# Patient Record
Sex: Female | Born: 1995 | Hispanic: No | Marital: Single | Smoking: Current every day smoker
Health system: Southern US, Community
[De-identification: ages and names within clinical notes are randomized; demographics above are authoritative.]

## PROBLEM LIST (undated history)

## (undated) DIAGNOSIS — O1490 Unspecified pre-eclampsia, unspecified trimester: Secondary | ICD-10-CM

## (undated) DIAGNOSIS — T8859XA Other complications of anesthesia, initial encounter: Secondary | ICD-10-CM

## (undated) DIAGNOSIS — O2340 Unspecified infection of urinary tract in pregnancy, unspecified trimester: Secondary | ICD-10-CM

## (undated) DIAGNOSIS — Z3490 Encounter for supervision of normal pregnancy, unspecified, unspecified trimester: Secondary | ICD-10-CM

## (undated) DIAGNOSIS — F419 Anxiety disorder, unspecified: Secondary | ICD-10-CM

## (undated) HISTORY — DX: Unspecified pre-eclampsia, unspecified trimester: O14.90

## (undated) HISTORY — PX: NO PAST SURGERIES: SHX2092

## (undated) HISTORY — DX: Other complications of anesthesia, initial encounter: T88.59XA

## (undated) HISTORY — DX: Unspecified infection of urinary tract in pregnancy, unspecified trimester: O23.40

---

## 2020-10-21 ENCOUNTER — Other Ambulatory Visit: Payer: Self-pay

## 2020-10-21 ENCOUNTER — Emergency Department (HOSPITAL_COMMUNITY)
Admission: EM | Admit: 2020-10-21 | Discharge: 2020-10-22 | Disposition: A | Discharge status: Home / Self Care | Payer: Self-pay | Attending: Emergency Medicine | Admitting: Emergency Medicine

## 2020-10-21 DIAGNOSIS — X500XXA Overexertion from strenuous movement or load, initial encounter: Secondary | ICD-10-CM | POA: Insufficient documentation

## 2020-10-21 DIAGNOSIS — S8391XA Sprain of unspecified site of right knee, initial encounter: Secondary | ICD-10-CM | POA: Insufficient documentation

## 2020-10-21 DIAGNOSIS — Y990 Civilian activity done for income or pay: Secondary | ICD-10-CM | POA: Insufficient documentation

## 2020-10-21 NOTE — ED Triage Notes (Signed)
Patient complaining Right leg and knee pain. Arrived by EMS. Hurt leg at work yesterday.  No pain w/ palpation Could not find relief w/ otc pain meds or ice.  142/98- 116- [id number]%

## 2020-10-22 ENCOUNTER — Emergency Department (HOSPITAL_COMMUNITY): Payer: Self-pay

## 2020-10-22 LAB — PREGNANCY, URINE: Preg Test, Ur: NEGATIVE

## 2020-10-22 MED ORDER — OXYCODONE-ACETAMINOPHEN 5-325 MG PO TABS: 1.0000 | ORAL_TABLET | ORAL | 0 refills | Status: DC | PRN

## 2020-10-22 MED ORDER — HYDROCODONE-ACETAMINOPHEN 5-325 MG PO TABS: 1.0000 | ORAL_TABLET | ORAL | 0 refills | Status: DC | PRN

## 2020-10-22 NOTE — Discharge Instructions (Signed)
I suspect that you tore a ligament in your knee.  You need to follow-up with an orthopedic surgeon to further evaluate this.  Contact your job to see if they want to send you to one of their specialists.  If not call Dr. Diamantina Providence office, listed above.

## 2020-10-22 NOTE — ED Notes (Signed)
AVS given no concerns at this time

## 2020-10-22 NOTE — ED Provider Notes (Signed)
[facility]   CSN: [id number] Arrival date & time: 10/21/20  2247     History Chief Complaint  Patient presents with  . Leg Pain    Ann Huerta is a 25 y.o. female.  Patient presents to the emergency department for evaluation of right knee injury.  Patient reports that she injured her knee yesterday at work.  She reports that she was carrying a heavy box, it turned and twisted her knee.  She felt her knee locked up and has been had pain and swelling since.  She took 4 over-the-counter ibuprofen before coming to the ER and reports that the pain is improved but she is having difficulty walking secondary to the pain.        No past medical history on file.  There are no problems to display for this patient.      OB History   No obstetric history on file.     No family history on file.     Home Medications Prior to Admission medications   Medication Sig Start Date End Date Taking? Authorizing Provider  oxyCODONE-acetaminophen (PERCOCET) 5-325 MG tablet Take 1-2 tablets by mouth every 4 (four) hours as needed. 10/22/20  Yes Artina Minella, Canary Brim, MD    Allergies    Patient has no allergy information on record.  Review of Systems   Review of Systems  Musculoskeletal: Positive for arthralgias.  Neurological: Negative.     Physical Exam Updated Vital Signs BP 126/78   Pulse (!) 103   Temp 98.2 F (36.8 C) (Oral)   Ht 5\' 3"  (1.6 m)   Wt 72.6 kg   LMP 09/24/2020 (Within Days)   SpO2 99%   BMI 28.34 kg/m   Physical Exam Vitals and nursing note reviewed.  Constitutional:      Appearance: Normal appearance.  HENT:     Head: Atraumatic.  Cardiovascular:     Pulses:          Dorsalis pedis pulses are 2+ on the right side.  Musculoskeletal:     Right knee: Swelling and effusion present. No deformity, erythema or ecchymosis. Decreased range of motion. Tenderness present. No LCL laxity, MCL laxity, ACL  laxity or PCL laxity. Normal alignment and normal patellar mobility. Normal pulse.  Skin:    General: Skin is warm and dry.     Findings: No bruising or erythema.  Neurological:     Mental Status: She is alert.     Sensory: Sensation is intact.     Motor: Motor function is intact.     ED Results / Procedures / Treatments   Labs (all labs ordered are listed, but only abnormal results are displayed) Labs Reviewed  PREGNANCY, URINE    EKG None  Radiology DG Knee Complete 4 Views Right  Result Date: 10/22/2020 CLINICAL DATA:  Right leg and knee pain EXAM: RIGHT KNEE - COMPLETE 4+ VIEW COMPARISON:  None. FINDINGS: No acute bony abnormality. Specifically, no fracture, subluxation, or dislocation. Large suprapatellar joint effusion. Anterior bowing of the distal quadriceps tendon. Significant soft tissue swelling is noted anterior to the patella as well as possible prepatellar bursal thickening. No acute bony abnormality. Specifically, no fracture, subluxation, or dislocation. Fragmented appearance of the superomedial patella with a corticated margin suggesting a bipartite configuration. IMPRESSION: 1. Large suprapatellar joint effusion resulting in some anterior bowing of the distal quadriceps tendon. Appearance concerning for internal joint derangement. 2. Significant soft tissue swelling anterior to the patella  as well as possible prepatellar bursal thickening noted as well. 3. No acute fracture or traumatic malalignment. 4. Appearance suggesting a bipartite patella though could correlate with point tenderness. Electronically Signed   By: Kreg Shropshire M.D.   On: 10/22/2020 00:53    Procedures Procedures   Medications Ordered in ED Medications - No data to display  ED Course  I have reviewed the triage vital signs and the nursing notes.  Pertinent labs & imaging results that were available during my care of the patient were reviewed by me and considered in my medical decision making  (see chart for details).    MDM Rules/Calculators/A&P                          Patient presents to the emergency department for evaluation of right knee injury.  Patient reports that she was carrying a heavy box and twisted the knee.  She reports that she felt it lock up when the injury occurred 1 day ago.  Examination today reveals significant joint effusion.  No erythema, warmth or signs of joint infection.  X-ray does not show fracture.  Presentation is concerning for internal derangement.  Patient placed in a knee immobilizer, provided analgesia.  She is to follow-up with orthopedics.  Patient might, however, need to follow-up with human resources for further direction on follow-up as this did occur at work.  Final Clinical Impression(s) / ED Diagnoses Final diagnoses:  Sprain of right knee, unspecified ligament, initial encounter    Rx / DC Orders ED Discharge Orders         Ordered    HYDROcodone-acetaminophen (NORCO/VICODIN) 5-325 MG tablet  Every 4 hours PRN,   Status:  Discontinued        10/22/20 0216    oxyCODONE-acetaminophen (PERCOCET) 5-325 MG tablet  Every 4 hours PRN        10/22/20 0224           Gilda Crease, MD 10/22/20 [phone]

## 2020-10-22 NOTE — ED Provider Notes (Incomplete)
[facility] Provider Note   CSN: [id number] Arrival date & time: 10/21/20  2247     History Chief Complaint  Patient presents with  . Leg Pain    Ann Huerta is a 25 y.o. female.  Patient presents to the emergency department for evaluation of right knee injury.  Patient reports that she injured her knee yesterday at work.  She reports that she was carrying a heavy box, it turned and twisted her knee.  She felt her knee locked up and has been had pain and swelling since.  She took 4 over-the-counter ibuprofen before coming to the ER and reports that the pain is improved but she is having difficulty walking secondary to the pain.        No past medical history on file.  There are no problems to display for this patient.   *** The histories are not reviewed yet. Please review them in the "History" navigator section and refresh this SmartLink.   OB History   No obstetric history on file.     No family history on file.     Home Medications Prior to Admission medications   Not on File    Allergies    Patient has no allergy information on record.  Review of Systems   Review of Systems  Musculoskeletal: Positive for arthralgias.  Neurological: Negative.     Physical Exam Updated Vital Signs BP 126/78   Pulse (!) 103   Temp 98.2 F (36.8 C) (Oral)   Ht 5\' 3"  (1.6 m)   Wt 72.6 kg   LMP 09/24/2020 (Within Days)   SpO2 99%   BMI 28.34 kg/m   Physical Exam Vitals and nursing note reviewed.  Constitutional:      Appearance: Normal appearance.  HENT:     Head: Atraumatic.  Cardiovascular:     Pulses:          Dorsalis pedis pulses are 2+ on the right side.  Musculoskeletal:     Right knee: Swelling and effusion present. No deformity, erythema or ecchymosis. Decreased range of motion. Tenderness present. No LCL laxity, MCL laxity, ACL laxity or PCL laxity. Normal alignment and normal patellar mobility. Normal pulse.   Skin:    General: Skin is warm and dry.     Findings: No bruising or erythema.  Neurological:     Mental Status: She is alert.     Sensory: Sensation is intact.     Motor: Motor function is intact.     ED Results / Procedures / Treatments   Labs (all labs ordered are listed, but only abnormal results are displayed) Labs Reviewed - No data to display  EKG None  Radiology No results found.  Procedures Procedures {Remember to document critical care time when appropriate:1}  Medications Ordered in ED Medications - No data to display  ED Course  I have reviewed the triage vital signs and the nursing notes.  Pertinent labs & imaging results that were available during my care of the patient were reviewed by me and considered in my medical decision making (see chart for details).    MDM Rules/Calculators/A&P                          Patient presents with pain and swelling of the right knee after a twisting injury.  Examination revealed significant joint effusion.  No overlying erythema, warmth or signs of infection.  X-ray without fracture but  there is a large effusion noted.  I do not appreciate any significant ligamentous laxity but presentation is concerning for internal derangement.  Patient placed in knee immobilizer, provided analgesia and will follow up with orthopedics in the office.  Final Clinical Impression(s) / ED Diagnoses Final diagnoses:  Sprain of right knee, unspecified ligament, initial encounter    Rx / DC Orders ED Discharge Orders    None

## 2020-11-28 ENCOUNTER — Other Ambulatory Visit: Payer: Self-pay

## 2020-11-28 ENCOUNTER — Emergency Department (HOSPITAL_COMMUNITY)
Admission: EM | Admit: 2020-11-28 | Discharge: 2020-11-28 | Disposition: A | Discharge status: Home / Self Care | Payer: Medicaid Other | Attending: Emergency Medicine | Admitting: Emergency Medicine

## 2020-11-28 DIAGNOSIS — Z0279 Encounter for issue of other medical certificate: Secondary | ICD-10-CM | POA: Insufficient documentation

## 2020-11-28 DIAGNOSIS — Z7689 Persons encountering health services in other specified circumstances: Secondary | ICD-10-CM

## 2020-11-28 NOTE — ED Triage Notes (Signed)
Pt requesting xr of R knee so she can go back to work. Reports pain and swelling better since being evaluated at [facility].

## 2020-11-28 NOTE — Discharge Instructions (Addendum)
Return to work.  If you have any new or worsening symptoms you need to follow with orthopedics

## 2020-11-28 NOTE — ED Provider Notes (Signed)
[facility] EMERGENCY DEPARTMENT Provider Note   CSN: [id number] Arrival date & time: 11/28/20  1435     History Return to work  Ann Huerta is a 25 y.o. female with past medical history who presents for evaluation of return to work note.  Right knee injury greater than 1 week ago.  Had x-rays which did not show any fractured bones however there was some swelling concern for possible internal derangement.  She was placed in knee immobilizer.  Patient states she has not been using crutches. Not using the immobilizer.  She has no pain.  She has full range of motion.  She needs a note to return to work.  States she does not to follow-up with orthopedics as she cannot afford this.  She tried to follow-up with employee health however they denied her claim.  Pain is 0/10.  She is ambulatory here in ED without difficulty.  Denies additional aggravating alleviating factors.  History obtained from patient and past medical records.  NO interpreter used.  HPI     No past medical history on file.  There are no problems to display for this patient.   History reviewed OB History   No obstetric history on file.     No family history on file.     Home Medications Prior to Admission medications   Medication Sig Start Date End Date Taking? Authorizing Provider  oxyCODONE-acetaminophen (PERCOCET) 5-325 MG tablet Take 1-2 tablets by mouth every 4 (four) hours as needed. 10/22/20   Gilda Crease, MD    Allergies    Patient has no known allergies.  Review of Systems   Review of Systems  Constitutional: Negative.   HENT: Negative.   Respiratory: Negative.   Cardiovascular: Negative.   Gastrointestinal: Negative.   Genitourinary: Negative.   Musculoskeletal: Negative.   Neurological: Negative.   All other systems reviewed and are negative.   Physical Exam Updated Vital Signs BP 114/85   Pulse 91   Temp 98.4 F (36.9 C)   Resp 14   SpO2 98%    Physical Exam Vitals and nursing note reviewed.  Constitutional:      General: She is not in acute distress.    Appearance: She is well-developed. She is not ill-appearing, toxic-appearing or diaphoretic.  HENT:     Head: Normocephalic and atraumatic.  Eyes:     Pupils: Pupils are equal, round, and reactive to light.  Cardiovascular:     Rate and Rhythm: Normal rate.  Pulmonary:     Effort: No respiratory distress.  Abdominal:     General: Bowel sounds are normal. There is no distension.  Musculoskeletal:        General: Normal range of motion.     Cervical back: Normal range of motion.     Comments: Full range of motion.  Able to flex and extend without difficulty.  Negative varus, valgus stress.  Negative anterior drawer.  No bony tenderness.  No overlying skin changes.  Skin:    General: Skin is warm and dry.     Capillary Refill: Capillary refill takes less than 2 seconds.     Comments: No edema, erythema or warmth.  No fluctuance induration.  Neurological:     General: No focal deficit present.     Mental Status: She is alert and oriented to person, place, and time.     Comments: Intact sensation Equal strength bilaterally Ambulatory in ED without difficulty     ED Results /  Procedures / Treatments   Labs (all labs ordered are listed, but only abnormal results are displayed) Labs Reviewed - No data to display  EKG None  Radiology No results found.  Procedures Procedures   Medications Ordered in ED Medications - No data to display  ED Course  I have reviewed the triage vital signs and the nursing notes.  Pertinent labs & imaging results that were available during my care of the patient were reviewed by me and considered in my medical decision making (see chart for details).  Patient here for evaluation for return to work note.  Was seen approximately 1 week ago for right knee pain after twisting it.  She is afebrile, nonseptic, non-ill-appearing.  Was  told to follow orthopedics over she cannot afford to do this.   Has not been using crutches or knee brace.  States her swelling is completely resolved.  Has a normal musculoskeletal exam.  She is neurovascularly intact.  Negative varus, valgus stress, negative anterior drawer.  No bony tenderness.  She is ambulatory here in ED without difficulty.  Provide work note.  Discussed if she has any new worsening symptoms she does need to follow-up with orthopedics  The patient has been appropriately medically screened and/or stabilized in the ED. I have low suspicion for any other emergent medical condition which would require further screening, evaluation or treatment in the ED or require inpatient management.  Patient is hemodynamically stable and in no acute distress.  Patient able to ambulate in department prior to ED.  Evaluation does not show acute pathology that would require ongoing or additional emergent interventions while in the emergency department or further inpatient treatment.  I have discussed the diagnosis with the patient and answered all questions.  Pain is been managed while in the emergency department and patient has no further complaints prior to discharge.  Patient is comfortable with plan discussed in room and is stable for discharge at this time.  I have discussed strict return precautions for returning to the emergency department.  Patient was encouraged to follow-up with PCP/specialist refer to at discharge.    MDM Rules/Calculators/A&P                           Final Clinical Impression(s) / ED Diagnoses Final diagnoses:  Return to work exam    Rx / DC Orders ED Discharge Orders    None       Jasmin Trumbull A, PA-C 11/28/20 1500    Benjiman Core, MD 11/30/20 [phone]

## 2020-12-27 ENCOUNTER — Encounter (HOSPITAL_COMMUNITY): Payer: Self-pay

## 2020-12-27 ENCOUNTER — Ambulatory Visit (HOSPITAL_COMMUNITY)
Admission: EM | Admit: 2020-12-27 | Discharge: 2020-12-27 | Disposition: A | Discharge status: Home / Self Care | Payer: No Typology Code available for payment source | Source: Ambulatory Visit | Attending: Emergency Medicine | Admitting: Emergency Medicine

## 2020-12-27 ENCOUNTER — Other Ambulatory Visit (HOSPITAL_COMMUNITY): Payer: Self-pay

## 2020-12-27 ENCOUNTER — Other Ambulatory Visit: Payer: Self-pay

## 2020-12-27 ENCOUNTER — Emergency Department (HOSPITAL_COMMUNITY)
Admission: EM | Admit: 2020-12-27 | Discharge: 2020-12-27 | Disposition: A | Discharge status: Home / Self Care | Payer: Medicaid Other | Attending: Emergency Medicine | Admitting: Emergency Medicine

## 2020-12-27 DIAGNOSIS — Z0441 Encounter for examination and observation following alleged adult rape: Secondary | ICD-10-CM | POA: Insufficient documentation

## 2020-12-27 DIAGNOSIS — T7421XA Adult sexual abuse, confirmed, initial encounter: Secondary | ICD-10-CM | POA: Insufficient documentation

## 2020-12-27 LAB — I-STAT BETA HCG BLOOD, ED (MC, WL, AP ONLY): I-stat hCG, quantitative: <5 m[IU]/mL (ref <5)

## 2020-12-27 LAB — COMPREHENSIVE METABOLIC PANEL
ALT: 12 U/L (ref 0–44)
AST: 14 U/L — ABNORMAL LOW (ref 15–41)
Albumin: 4.4 g/dL (ref 3.5–5.0)
Alkaline Phosphatase: 50 U/L (ref 38–126)
Anion gap: 5 (ref 5–15)
BUN: 13 mg/dL (ref 6–20)
CO2: 24 mmol/L (ref 22–32)
Calcium: 9.1 mg/dL (ref 8.9–10.3)
Chloride: 110 mmol/L (ref 98–111)
Creatinine, Ser: 0.8 mg/dL (ref 0.44–1.00)
GFR, Estimated: >60 mL/min (ref >60)
Glucose, Bld: 114 mg/dL — ABNORMAL HIGH (ref 70–99)
Potassium: 3.7 mmol/L (ref 3.5–5.1)
Sodium: 139 mmol/L (ref 135–145)
Total Bilirubin: 0.6 mg/dL (ref 0.3–1.2)
Total Protein: 7.5 g/dL (ref 6.5–8.1)

## 2020-12-27 LAB — HEPATITIS C ANTIBODY: HCV Ab: NONREACTIVE

## 2020-12-27 LAB — RAPID HIV SCREEN (HIV 1/2 AB+AG)
HIV 1/2 Antibodies: NONREACTIVE
HIV-1 P24 Antigen - HIV24: NONREACTIVE

## 2020-12-27 LAB — HEPATITIS B SURFACE ANTIGEN: Hepatitis B Surface Ag: NONREACTIVE

## 2020-12-27 MED ORDER — ELVITEG-COBIC-EMTRICIT-TENOFAF 150-150-200-10 MG PO TABS: 1.0000 | ORAL_TABLET | Freq: Every day | ORAL | 0 refills | Status: DC

## 2020-12-27 MED ORDER — AZITHROMYCIN 250 MG PO TABS: 1000.0000 mg | ORAL_TABLET | Freq: Once | ORAL | Status: DC

## 2020-12-27 MED ORDER — ELVITEG-COBIC-EMTRICIT-TENOFAF 150-150-200-10 MG PO TABS
1.0000 | ORAL_TABLET | Freq: Every day | ORAL | 0 refills | Status: DC
  Filled 2020-12-27 – 2020-12-28 (×2): qty 30, 30d supply, fill #0

## 2020-12-27 MED ORDER — AZITHROMYCIN 250 MG PO TABS: 2000.0000 mg | ORAL_TABLET | Freq: Once | ORAL | Status: DC

## 2020-12-27 MED ORDER — ULIPRISTAL ACETATE 30 MG PO TABS
30.0000 mg | ORAL_TABLET | Freq: Once | ORAL | Status: AC
  Administered 2020-12-27: 30 mg via ORAL

## 2020-12-27 MED ORDER — NICOTINE 14 MG/24HR TD PT24
14.0000 mg | MEDICATED_PATCH | Freq: Once | TRANSDERMAL | Status: DC
  Filled 2020-12-27: qty 1

## 2020-12-27 MED ORDER — LIDOCAINE HCL (PF) 1 % IJ SOLN
1.0000 mL | Freq: Once | INTRAMUSCULAR | Status: AC
  Administered 2020-12-27: 1 mL

## 2020-12-27 MED ORDER — METRONIDAZOLE 500 MG PO TABS
2000.0000 mg | ORAL_TABLET | Freq: Once | ORAL | Status: AC
  Administered 2020-12-27: 2000 mg via ORAL

## 2020-12-27 MED ORDER — ELVITEG-COBIC-EMTRICIT-TENOFAF 150-150-200-10 MG PREPACK
ORAL_TABLET | ORAL | Status: AC
  Administered 2020-12-27: 5 via ORAL
  Filled 2020-12-27: qty 1

## 2020-12-27 MED ORDER — AZITHROMYCIN 250 MG PO TABS
1000.0000 mg | ORAL_TABLET | Freq: Once | ORAL | Status: AC
  Administered 2020-12-27: 1000 mg via ORAL

## 2020-12-27 MED ORDER — METRONIDAZOLE 500 MG PO TABS: 2000.0000 mg | ORAL_TABLET | Freq: Once | ORAL | Status: DC

## 2020-12-27 MED ORDER — ELVITEG-COBIC-EMTRICIT-TENOFAF 150-150-200-10 MG PREPACK: 1.0000 | ORAL_TABLET | Freq: Once | ORAL | Status: AC

## 2020-12-27 MED ORDER — ONDANSETRON 4 MG PO TBDP
4.0000 mg | ORAL_TABLET | Freq: Once | ORAL | Status: AC
  Administered 2020-12-27: 4 mg via ORAL
  Filled 2020-12-27: qty 1

## 2020-12-27 MED ORDER — CEFTRIAXONE SODIUM 1 G IJ SOLR: 2.0000 g | Freq: Once | INTRAMUSCULAR | Status: DC

## 2020-12-27 MED ORDER — CEFTRIAXONE SODIUM 1 G IJ SOLR
500.0000 mg | Freq: Once | INTRAMUSCULAR | Status: AC
Start: 2020-12-27 — End: 2020-12-27
  Administered 2020-12-27: 500 mg via INTRAMUSCULAR
  Filled 2020-12-27: qty 10

## 2020-12-27 NOTE — ED Notes (Signed)
Per SANE RN- Dorena Bodo, RN- she will call [employer] for pick up for kit after she has finished collecting.
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
## 2020-12-27 NOTE — Consult Note (Signed)
The SANE/FNE (Forensic Nurse Examiner) consult has been completed. The primary RN and provider have been notified. Please contact the SANE/FNE nurse on call (listed in Amion) with any further concerns.  
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
## 2020-12-27 NOTE — ED Notes (Signed)
SANE nurse paged

## 2020-12-27 NOTE — Discharge Instructions (Signed)
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 possible side effects. Call your doctor for medical advice about side effects. You may report side effects to FDA at 1-800-FDA-1088. Where should I keep my medicine? Keep out of the reach of children and  pets. You will be instructed on how to store this drug. Protect from light. Throw away any unused drug after the expiration date. NOTE: This sheet is a summary. It may not cover all possible information. If you have questions about this medicine, talk to your doctor, pharmacist, or health care provider.  2020 Elsevier/Gold Standard (2019-03-12 18:29:21)

## 2020-12-28 ENCOUNTER — Other Ambulatory Visit (HOSPITAL_COMMUNITY): Payer: Self-pay

## 2020-12-28 LAB — RPR: RPR Ser Ql: NONREACTIVE

## 2020-12-29 ENCOUNTER — Telehealth: Payer: Self-pay | Admitting: Family Medicine
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
## 2020-12-29 NOTE — Telephone Encounter (Signed)
LVM with pt to call back to confirm appts or resch

## 2020-12-29 NOTE — Consult Note (Signed)
  Engineer, building services obtained and emailed to [employer].   BIN: [id number] PCN:  [id number] Group:  [id number] Member:  [id number]

## 2021-01-09 ENCOUNTER — Other Ambulatory Visit: Payer: Self-pay | Admitting: General Practice

## 2021-01-09 DIAGNOSIS — Z113 Encounter for screening for infections with a predominantly sexual mode of transmission: Secondary | ICD-10-CM

## 2021-01-10 ENCOUNTER — Other Ambulatory Visit: Payer: Medicaid Other

## 2021-01-18 DIAGNOSIS — T7421XA Adult sexual abuse, confirmed, initial encounter: Secondary | ICD-10-CM

## 2021-01-18 HISTORY — DX: Adult sexual abuse, confirmed, initial encounter: T74.21XA

## 2021-02-09 ENCOUNTER — Other Ambulatory Visit: Payer: Medicaid Other

## 2021-03-01 ENCOUNTER — Other Ambulatory Visit (HOSPITAL_COMMUNITY): Payer: Self-pay

## 2021-03-01 ENCOUNTER — Other Ambulatory Visit: Payer: Self-pay

## 2021-03-01 ENCOUNTER — Emergency Department (HOSPITAL_COMMUNITY)
Admission: EM | Admit: 2021-03-01 | Discharge: 2021-03-01 | Disposition: A | Discharge status: Home / Self Care | Payer: Medicaid Other | Attending: Emergency Medicine | Admitting: Emergency Medicine

## 2021-03-01 ENCOUNTER — Encounter (HOSPITAL_COMMUNITY): Payer: Self-pay

## 2021-03-01 DIAGNOSIS — H6122 Impacted cerumen, left ear: Secondary | ICD-10-CM | POA: Insufficient documentation

## 2021-03-01 DIAGNOSIS — K047 Periapical abscess without sinus: Secondary | ICD-10-CM

## 2021-03-01 LAB — I-STAT BETA HCG BLOOD, ED (MC, WL, AP ONLY): I-stat hCG, quantitative: >2000 m[IU]/mL — ABNORMAL HIGH (ref <5)

## 2021-03-01 MED ORDER — PENICILLIN V POTASSIUM 500 MG PO TABS
500.0000 mg | ORAL_TABLET | Freq: Four times a day (QID) | ORAL | 0 refills | Status: AC
  Filled 2021-03-01: qty 28, 7d supply, fill #0

## 2021-03-01 NOTE — Discharge Instructions (Addendum)
Suspect you have a dental infection, I have started you on antibiotics, please take as prescribed.  Recommend over-the-counter pain medications.  You must follow-up with a dentist for further evaluation.  Come back to the emergency department if you develop chest pain, shortness of breath, severe abdominal pain, uncontrolled nausea, vomiting, diarrhea.

## 2021-03-01 NOTE — ED Provider Notes (Signed)
[facility] EMERGENCY DEPARTMENT Provider Note   CSN: [id number] Arrival date & time: 03/01/21  1403     History No chief complaint on file.   Ann Huerta is a 25 y.o. female.  HPI  Presents with no significant medical history presents to the emergency department with chief complaint of right lower dental pain.  Patient states started proxy 2 days ago, states it came on suddenly, pain radiates from her lower jaw into her right ear, denies lips, tongue, throat swelling, difficulty swallowing, denies decrease in hearing, discharge from the ear, denies any recent trauma the area.  States she is not seen a dentist in a long time.  She denies headaches, fevers, chills, indigestion sore throat or cough.  She has no other complaints this time.  History reviewed. No pertinent past medical history.  There are no problems to display for this patient.   History reviewed. No pertinent surgical history.   OB History   No obstetric history on file.     No family history on file.     Home Medications Prior to Admission medications   Medication Sig Start Date End Date Taking? Authorizing Provider  penicillin v potassium (VEETID) 500 MG tablet Take 1 tablet (500 mg total) by mouth 4 (four) times daily for 7 days. 03/01/21 03/08/21 Yes Carroll Sage, PA-C  elvitegravir-cobicistat-emtricitabine-tenofovir (GENVOYA) 150-150-200-10 MG TABS tablet Take 1 tablet by mouth daily with breakfast. 12/27/20   Henderly, Britni A, PA-C  oxyCODONE-acetaminophen (PERCOCET) 5-325 MG tablet Take 1-2 tablets by mouth every 4 (four) hours as needed. 10/22/20   Gilda Crease, MD    Allergies    Patient has no known allergies.  Review of Systems   Review of Systems  Constitutional:  Negative for chills and fever.  HENT:  Positive for dental problem and ear pain. Negative for congestion, drooling, ear discharge, facial swelling and tinnitus.   Eyes:  Negative for visual  disturbance.  Respiratory:  Negative for shortness of breath.   Cardiovascular:  Negative for chest pain.  Gastrointestinal:  Negative for abdominal pain.  Genitourinary:  Negative for enuresis.  Musculoskeletal:  Negative for back pain.  Skin:  Negative for rash.  Neurological:  Negative for dizziness.  Hematological:  Does not bruise/bleed easily.   Physical Exam Updated Vital Signs BP 122/76 (BP Location: Right Arm)   Pulse 81   Temp 98.2 F (36.8 C) (Oral)   Resp 18   SpO2 100%   Physical Exam Vitals and nursing note reviewed.  Constitutional:      General: She is not in acute distress.    Appearance: She is not ill-appearing.  HENT:     Head: Normocephalic and atraumatic.     Right Ear: Tympanic membrane, ear canal and external ear normal.     Left Ear: There is impacted cerumen.     Nose: No congestion.     Mouth/Throat:     Mouth: Mucous membranes are moist.     Pharynx: Oropharynx is clear. No oropharyngeal exudate or posterior oropharyngeal erythema.     Comments: Oropharynx is visualized tongue and uvula are both midline, controlling oral secretions, she has noted dental cavities on the right back molar, there is no surrounding erythema or edema along the gumline, no fluctuance or induration present. Eyes:     Extraocular Movements: Extraocular movements intact.     Conjunctiva/sclera: Conjunctivae normal.  Cardiovascular:     Rate and Rhythm: Normal rate and regular rhythm.  Pulses: Normal pulses.  Pulmonary:     Effort: Pulmonary effort is normal.     Breath sounds: No rhonchi.  Skin:    General: Skin is warm and dry.  Neurological:     Mental Status: She is alert.  Psychiatric:        Mood and Affect: Mood normal.    ED Results / Procedures / Treatments   Labs (all labs ordered are listed, but only abnormal results are displayed) Labs Reviewed - No data to display  EKG None  Radiology No results found.  Procedures Procedures    Medications Ordered in ED Medications - No data to display  ED Course  I have reviewed the triage vital signs and the nursing notes.  Pertinent labs & imaging results that were available during my care of the patient were reviewed by me and considered in my medical decision making (see chart for details).    MDM Rules/Calculators/A&P                         Initial impression-presents with right dental pain.  She is alert, does not appear acute stress, vital signs reassuring.  Work-up-due to well-appearing patient, benign for exam, further lab or imaging warranted at this time.  Rule out- I have low suspicion for peritonsillar abscess, retropharyngeal abscess, or Ludwig angina as oropharynx was visualized tongue and uvula were both midline, there is no exudates, erythema or edema noted in the posterior pillars or on/ around tonsils.  Low suspicion for an abscess as gumline were palpated no fluctuance or induration felt.  Low suspicion for periorbital or orbital cellulitis as patient face had no erythematous, patient EOMs were intact, he had no pain with eye movement.  Low suspicion for otitis media or externa is no signs infection present my exam.   Plan-  Dental pain suspect secondary due to dental infection, will start on antibiotics, have her follow-up with a dentist for further evaluation.  Vital signs have remained stable, no indication for hospital admission.  .  Patient given at home care as well strict return precautions.  Patient verbalized that they understood agreed to said plan.  Final Clinical Impression(s) / ED Diagnoses Final diagnoses:  Dental infection    Rx / DC Orders ED Discharge Orders          Ordered    penicillin v potassium (VEETID) 500 MG tablet  4 times daily        03/01/21 1523             Barnie Del 03/01/21 1524    Jacalyn Lefevre, MD 03/02/21 [phone]

## 2021-03-01 NOTE — ED Triage Notes (Signed)
Patient complains of right lower dental problem with radiation to right ear x 2 days. Taking advil with some relief

## 2021-03-09 ENCOUNTER — Other Ambulatory Visit (HOSPITAL_COMMUNITY): Payer: Self-pay

## 2021-04-06 ENCOUNTER — Encounter (HOSPITAL_COMMUNITY): Payer: Self-pay

## 2021-04-06 ENCOUNTER — Emergency Department (HOSPITAL_COMMUNITY): Payer: Medicaid Other

## 2021-04-06 ENCOUNTER — Other Ambulatory Visit: Payer: Self-pay

## 2021-04-06 ENCOUNTER — Emergency Department (HOSPITAL_COMMUNITY)
Admission: EM | Admit: 2021-04-06 | Discharge: 2021-04-07 | Disposition: A | Discharge status: Home / Self Care | Payer: Medicaid Other | Attending: Emergency Medicine | Admitting: Emergency Medicine

## 2021-04-06 DIAGNOSIS — Z20822 Contact with and (suspected) exposure to covid-19: Secondary | ICD-10-CM | POA: Insufficient documentation

## 2021-04-06 DIAGNOSIS — O99332 Smoking (tobacco) complicating pregnancy, second trimester: Secondary | ICD-10-CM | POA: Insufficient documentation

## 2021-04-06 DIAGNOSIS — S9032XA Contusion of left foot, initial encounter: Secondary | ICD-10-CM | POA: Insufficient documentation

## 2021-04-06 DIAGNOSIS — Z28310 Unvaccinated for covid-19: Secondary | ICD-10-CM | POA: Insufficient documentation

## 2021-04-06 DIAGNOSIS — Z3A14 14 weeks gestation of pregnancy: Secondary | ICD-10-CM | POA: Insufficient documentation

## 2021-04-06 DIAGNOSIS — O9A212 Injury, poisoning and certain other consequences of external causes complicating pregnancy, second trimester: Secondary | ICD-10-CM | POA: Insufficient documentation

## 2021-04-06 DIAGNOSIS — R8271 Bacteriuria: Secondary | ICD-10-CM

## 2021-04-06 DIAGNOSIS — R102 Pelvic and perineal pain: Secondary | ICD-10-CM | POA: Insufficient documentation

## 2021-04-06 DIAGNOSIS — N939 Abnormal uterine and vaginal bleeding, unspecified: Secondary | ICD-10-CM

## 2021-04-06 DIAGNOSIS — F17210 Nicotine dependence, cigarettes, uncomplicated: Secondary | ICD-10-CM | POA: Insufficient documentation

## 2021-04-06 DIAGNOSIS — W231XXA Caught, crushed, jammed, or pinched between stationary objects, initial encounter: Secondary | ICD-10-CM | POA: Insufficient documentation

## 2021-04-06 DIAGNOSIS — R109 Unspecified abdominal pain: Secondary | ICD-10-CM

## 2021-04-06 DIAGNOSIS — O219 Vomiting of pregnancy, unspecified: Secondary | ICD-10-CM | POA: Insufficient documentation

## 2021-04-06 DIAGNOSIS — O2392 Unspecified genitourinary tract infection in pregnancy, second trimester: Secondary | ICD-10-CM | POA: Insufficient documentation

## 2021-04-06 LAB — URINALYSIS, ROUTINE W REFLEX MICROSCOPIC
Bilirubin Urine: NEGATIVE
Glucose, UA: NEGATIVE mg/dL
Hgb urine dipstick: NEGATIVE
Ketones, ur: 80 mg/dL — AB
Nitrite: POSITIVE — AB
Protein, ur: 100 mg/dL — AB
Specific Gravity, Urine: 1.023 (ref 1.005–1.030)
WBC, UA: >50 WBC/hpf — ABNORMAL HIGH (ref 0–5)
pH: 8 (ref 5.0–8.0)

## 2021-04-06 LAB — COMPREHENSIVE METABOLIC PANEL
ALT: 13 U/L (ref 0–44)
AST: 15 U/L (ref 15–41)
Albumin: 3.6 g/dL (ref 3.5–5.0)
Alkaline Phosphatase: 43 U/L (ref 38–126)
Anion gap: 10 (ref 5–15)
BUN: 10 mg/dL (ref 6–20)
CO2: 23 mmol/L (ref 22–32)
Calcium: 9 mg/dL (ref 8.9–10.3)
Chloride: 104 mmol/L (ref 98–111)
Creatinine, Ser: 0.81 mg/dL (ref 0.44–1.00)
GFR, Estimated: >60 mL/min (ref >60)
Glucose, Bld: 94 mg/dL (ref 70–99)
Potassium: 3.3 mmol/L — ABNORMAL LOW (ref 3.5–5.1)
Sodium: 137 mmol/L (ref 135–145)
Total Bilirubin: 0.9 mg/dL (ref 0.3–1.2)
Total Protein: 6.9 g/dL (ref 6.5–8.1)

## 2021-04-06 LAB — CBC
HCT: 33.2 % — ABNORMAL LOW (ref 36.0–46.0)
Hemoglobin: 11.4 g/dL — ABNORMAL LOW (ref 12.0–15.0)
MCH: 33.2 pg (ref 26.0–34.0)
MCHC: 34.3 g/dL (ref 30.0–36.0)
MCV: 96.8 fL (ref 80.0–100.0)
Platelets: 247 10*3/uL (ref 150–400)
RBC: 3.43 MIL/uL — ABNORMAL LOW (ref 3.87–5.11)
RDW: 13.7 % (ref 11.5–15.5)
WBC: 8.4 10*3/uL (ref 4.0–10.5)
nRBC: 0 % (ref 0.0–0.2)

## 2021-04-06 LAB — RESP PANEL BY RT-PCR (FLU A&B, COVID) ARPGX2
Influenza A by PCR: NEGATIVE
Influenza B by PCR: NEGATIVE
SARS Coronavirus 2 by RT PCR: NEGATIVE

## 2021-04-06 LAB — HCG, QUANTITATIVE, PREGNANCY: hCG, Beta Chain, Quant, S: 99323 m[IU]/mL — ABNORMAL HIGH (ref <5)

## 2021-04-06 LAB — I-STAT BETA HCG BLOOD, ED (MC, WL, AP ONLY): I-stat hCG, quantitative: >2000 m[IU]/mL — ABNORMAL HIGH (ref <5)

## 2021-04-06 LAB — LIPASE, BLOOD: Lipase: 20 U/L (ref 11–51)

## 2021-04-06 MED ORDER — DOXYLAMINE-PYRIDOXINE 10-10 MG PO TBEC
10.0000 mg | DELAYED_RELEASE_TABLET | Freq: Every day | ORAL | 0 refills | Status: DC
  Filled 2021-04-06: qty 30, 30d supply, fill #0

## 2021-04-06 MED ORDER — SODIUM CHLORIDE 0.9 % IV BOLUS
1000.0000 mL | Freq: Once | INTRAVENOUS | Status: AC
  Administered 2021-04-06: 1000 mL via INTRAVENOUS

## 2021-04-06 MED ORDER — SUCRALFATE 1 G PO TABS
1.0000 g | ORAL_TABLET | Freq: Once | ORAL | Status: AC
  Administered 2021-04-06: 1 g via ORAL
  Filled 2021-04-06: qty 1

## 2021-04-06 MED ORDER — SODIUM CHLORIDE 0.9 % IV SOLN
1.0000 g | Freq: Once | INTRAVENOUS | Status: AC
  Administered 2021-04-06: 1 g via INTRAVENOUS
  Filled 2021-04-06: qty 10

## 2021-04-06 MED ORDER — CEPHALEXIN 500 MG PO CAPS
500.0000 mg | ORAL_CAPSULE | Freq: Two times a day (BID) | ORAL | 0 refills | Status: AC
  Filled 2021-04-06: qty 14, 7d supply, fill #0

## 2021-04-06 NOTE — ED Triage Notes (Signed)
Patient c/o "vomiting acid" x a couple of months, wants a pregnancy test, and states she jammed the left 2nd toe

## 2021-04-06 NOTE — ED Provider Notes (Signed)
Ann Huerta Provider Note   CSN: [id number] Arrival date & time: 04/06/21  1500     History Chief Complaint  Patient presents with   Emesis    Ann Huerta is a 25 y.o. female.  This is 25 yo female who presents to ED with multiple complaints.  Patient reports has been having intermittent nausea and vomiting over the past 2 months.  She has been tolerating her typical oral intake.  Tolerating liquids properly.  No change to bowel or bladder function.  Denies fevers or chills.  Abdominal cramping yesterday but resolved spontaneously. No abnormal vaginal bleeding or discharge..  No recent travel or sick contacts.  Patient states for the past 24 hours she has been feeling more tired than normal, "run down." Nausea/emesis worse after oral intake but this is intermittent. Not a/w abdominal pain. Has not received COVID19 vaccine. Does not f/u with GI, does not f/u with PCP.   Pt also stubbed her toe yesterday and has a bruise, asking for XR. She is ambulatory, pain well controlled.   The history is provided by the patient. No language interpreter was used.  Emesis Associated symptoms: abdominal pain and arthralgias   Associated symptoms: no chills, no cough, no diarrhea, no fever and no headaches       History reviewed. No pertinent past medical history.  There are no problems to display for this patient.   History reviewed. No pertinent surgical history.   OB History   No obstetric history on file.     Family History  Problem Relation Age of Onset   Multiple sclerosis Mother     Social History   Tobacco Use   Smoking status: Every Day    Packs/day: 0.50    Types: Cigarettes   Smokeless tobacco: Never  Vaping Use   Vaping Use: Never used  Substance Use Topics   Alcohol use: Never   Drug use: Yes    Types: Marijuana    Home Medications Prior to Admission medications   Medication Sig Start Date End Date Taking? Authorizing  Provider  cephALEXin (KEFLEX) 500 MG capsule Take 1 capsule (500 mg total) by mouth 2 (two) times daily for 7 days. 04/06/21 04/13/21 Yes Tanda RockersGray, Arnetra Terris A, DO  Doxylamine-Pyridoxine 10-10 MG TBEC Take 10 mg by mouth daily. 04/06/21  Yes Sloan LeiterGray, Shaleka Brines A, DO  elvitegravir-cobicistat-emtricitabine-tenofovir (GENVOYA) 150-150-200-10 MG TABS tablet Take 1 tablet by mouth daily with breakfast. 12/27/20   Henderly, Britni A, PA-C  oxyCODONE-acetaminophen (PERCOCET) 5-325 MG tablet Take 1-2 tablets by mouth every 4 (four) hours as needed. 10/22/20   Gilda CreasePollina, Christopher J, MD    Allergies    Patient has no known allergies.  Review of Systems   Review of Systems  Constitutional:  Negative for chills and fever.  HENT:  Negative for facial swelling and trouble swallowing.   Eyes:  Negative for photophobia and visual disturbance.  Respiratory:  Negative for cough and shortness of breath.   Cardiovascular:  Negative for chest pain and palpitations.  Gastrointestinal:  Positive for abdominal pain, nausea and vomiting. Negative for diarrhea and rectal pain.  Endocrine: Negative for polydipsia and polyuria.  Genitourinary:  Negative for difficulty urinating and hematuria.  Musculoskeletal:  Positive for arthralgias. Negative for gait problem and joint swelling.  Skin:  Negative for pallor and rash.  Neurological:  Negative for syncope and headaches.  Psychiatric/Behavioral:  Negative for agitation and confusion.    Physical Exam Updated Vital Signs BP Marland Kitchen(!)  107/57   Pulse 69   Temp 98.2 F (36.8 C)   Resp 17   Ht 5' 3.5" (1.613 m)   Wt 72.6 kg   LMP 01/04/2021 (Approximate)   SpO2 100%   BMI 27.90 kg/m   Physical Exam Vitals and nursing note reviewed.  Constitutional:      General: She is not in acute distress.    Appearance: Normal appearance.  HENT:     Head: Normocephalic and atraumatic.     Right Ear: External ear normal.     Left Ear: External ear normal.     Nose: Nose normal.      Mouth/Throat:     Mouth: Mucous membranes are moist.  Eyes:     General: No scleral icterus.       Right eye: No discharge.        Left eye: No discharge.  Cardiovascular:     Rate and Rhythm: Normal rate and regular rhythm.     Pulses: Normal pulses.     Heart sounds: Normal heart sounds.  Pulmonary:     Effort: Pulmonary effort is normal. No respiratory distress.     Breath sounds: Normal breath sounds.  Abdominal:     General: Abdomen is flat. Bowel sounds are normal.     Palpations: Abdomen is soft.     Tenderness: There is no abdominal tenderness. There is no right CVA tenderness or left CVA tenderness.  Musculoskeletal:        General: Normal range of motion.     Cervical back: Normal range of motion.     Right lower leg: No edema.     Left lower leg: No edema.       Feet:  Skin:    General: Skin is warm and dry.     Capillary Refill: Capillary refill takes less than 2 seconds.  Neurological:     Mental Status: She is alert.  Psychiatric:        Mood and Affect: Mood normal.        Behavior: Behavior normal.    ED Results / Procedures / Treatments   Labs (all labs ordered are listed, but only abnormal results are displayed) Labs Reviewed  COMPREHENSIVE METABOLIC PANEL - Abnormal; Notable for the following components:      Result Value   Potassium 3.3 (*)    All other components within normal limits  CBC - Abnormal; Notable for the following components:   RBC 3.43 (*)    Hemoglobin 11.4 (*)    HCT 33.2 (*)    All other components within normal limits  URINALYSIS, ROUTINE W REFLEX MICROSCOPIC - Abnormal; Notable for the following components:   Color, Urine AMBER (*)    APPearance TURBID (*)    Ketones, ur 80 (*)    Protein, ur 100 (*)    Nitrite POSITIVE (*)    Leukocytes,Ua LARGE (*)    WBC, UA >50 (*)    Bacteria, UA MANY (*)    All other components within normal limits  HCG, QUANTITATIVE, PREGNANCY - Abnormal; Notable for the following components:    hCG, Beta Chain, Quant, S [id number] (*)    All other components within normal limits  I-STAT BETA HCG BLOOD, ED (MC, [facility], AP ONLY) - Abnormal; Notable for the following components:   I-stat hCG, quantitative >2,000.0 (*)    All other components within normal limits  RESP PANEL BY RT-PCR (FLU A&B, COVID) [id number]  URINE CULTURE  LIPASE, BLOOD  HCG, QUANTITATIVE, PREGNANCY    EKG None  Radiology US OB Limited  Result Date: 04/06/2021 CLINICAL DATA:  Pregnancy, cramping, vaginal bleeding EXAM: LIMITED OBSTETRIC ULTRASOUND COMPARISON:  None. FINDINGS: Number of Fetuses: 1 Heart Rate:  148 bpm Movement: Yes Presentation: Cephalic Placental Location: Anterior Previa: No Amniotic Fluid (Subjective):  Within normal limits. BPD: 6.7 cm cm 14 w  5 d MATERNAL FINDINGS: Cervix:  Appears closed. Uterus/Adnexae: No abnormality visualized. IMPRESSION: Single intrauterine pregnancy with detectable cardiac activity. No sonographic abnormality identified. Estimated gestational age [redacted] weeks 5 days. This exam is performed on an emergent basis and does not comprehensively evaluate fetal size, dating, or anatomy; follow-up complete OB US should be considered if further fetal assessment is warranted. Electronically Signed   By: Caprice Renshaw M.D.   On: 04/06/2021 18:57   DG Foot 2 Views Left  Result Date: 04/06/2021 CLINICAL DATA:  Toe injury. Left second toe pain since yesterday. Jammed toe yesterday. EXAM: LEFT FOOT - 2 VIEW COMPARISON:  None. FINDINGS: There is no evidence of fracture or dislocation. Particularly, no visualized fracture of the second toe. Incidental os navicular. There is no evidence of arthropathy or other focal bone abnormality. Soft tissues are unremarkable. IMPRESSION: Negative radiographs of the left foot. No fracture, with special attention to the second toe. Electronically Signed   By: Narda Rutherford M.D.   On: 04/06/2021 16:34    Procedures Procedures   Medications Ordered in ED Medications   sodium chloride 0.9 % bolus 1,000 mL (1,000 mLs Intravenous Bolus 04/06/21 1617)  sucralfate (CARAFATE) tablet 1 g (1 g Oral Given 04/06/21 1617)  cefTRIAXone (ROCEPHIN) 1 g in sodium chloride 0.9 % 100 mL IVPB (0 g Intravenous Stopped 04/06/21 1748)    ED Course  I have reviewed the triage vital signs and the nursing notes.  Pertinent labs & imaging results that were available during my care of the patient were reviewed by me and considered in my medical decision making (see chart for details).    MDM Rules/Calculators/A&P                           This patient complains of emesis, nausea, cramping; this involves an extensive number of treatment Options and is a complaint that carries with it a high risk of complications and Morbidity. Serious etiologies considered.   I ordered, reviewed and interpreted labs, which included bhcg which was elevated; ultrasound ordered.  Urinalysis with significant contamination however it is nitrite and leukocyte esterase positive.  Given patient is pregnant we will treat for bacteriuria.  Urine culture sent.   I ordered medication carafate, IV abx  I ordered imaging studies which included TV ultrasound and I independently    visualized and interpreted imaging which showed IUP around 14 wks 5/7 days, detectable cardiac activity.   Foot XR is non-acute. Discussed supportive care. Contusion of foot.    After the interventions stated above, I reevaluated the patient and found patient is pregnant, she was unsure about her bhcg results from a year ago. Discussed diclegis regarding n/v. She is able to tolerate PO in the ER and no further emesis. Legrand Rams this is 2/2 pregnancy.  Recommend she f/u with OBGYN in next 48 hours. She is agreeable.   Pt with UA with contamination but concerning for possible infection, culture sent. Started ABX.    The patient improved significantly and was discharged in stable condition. Detailed discussions were had with the patient  regarding current findings, and need for close f/u with PCP or on call doctor. The patient has been instructed to return immediately if the symptoms worsen in any way for re-evaluation. Patient verbalized understanding and is in agreement with current care plan. All questions answered prior to discharge.    Final Clinical Impression(s) / ED Diagnoses Final diagnoses:  Nausea and vomiting in pregnancy  Abdominal pain, unspecified abdominal location  Bacteria in urine  Contusion of left foot, initial encounter    Rx / DC Orders ED Discharge Orders          Ordered    Doxylamine-Pyridoxine 10-10 MG TBEC  Daily        04/06/21 1950    cephALEXin (KEFLEX) 500 MG capsule  2 times daily        04/06/21 2028             Sloan Leiter, DO 04/06/21 2037

## 2021-04-07 ENCOUNTER — Other Ambulatory Visit (HOSPITAL_COMMUNITY): Payer: Self-pay

## 2021-04-08 ENCOUNTER — Other Ambulatory Visit (HOSPITAL_COMMUNITY): Payer: Self-pay

## 2021-04-09 LAB — URINE CULTURE: Culture: 70000 — AB

## 2021-04-10 ENCOUNTER — Telehealth: Payer: Self-pay

## 2021-04-10 ENCOUNTER — Other Ambulatory Visit (HOSPITAL_COMMUNITY): Payer: Self-pay

## 2021-04-10 NOTE — Telephone Encounter (Signed)
Post ED Visit - Positive Culture Follow-up  Culture report reviewed by antimicrobial stewardship pharmacist: [facility] []  , Pharm.D. []  Enzo Bi, Pharm.D., BCPS AQ-ID []  , Pharm.D., BCPS []  Celedonio Miyamoto, .D., BCPS []  [place], .D., BCPS, AAHIVP []  Georgina Pillion, Pharm.D., BCPS, AAHIVP []  [address], PharmD, BCPS []  , PharmD, BCPS []  [place], PharmD, BCPS []  [address], PharmD []  , PharmD, BCPS []  Estella Husk, PharmD  Pharmacy Team [x]  Lysle Pearl, PharmD []  , PharmD []  Phillips Climes, PharmD []  , Rph []  Agapito Games) , PharmD []  Verlan Friends, PharmD []  , PharmD []  Mervyn Gay, PharmD []  , PharmD []  Vinnie Level, PharmD []  [facility], PharmD []  , PharmD []  Rennis Chris, PharmD   Positive urine culture Treated with Cephalexin, organism sensitive to the same and no further patient follow-up is required at this time.  04/10/2021, 9:20 AM

## 2021-04-11 ENCOUNTER — Other Ambulatory Visit: Payer: Self-pay | Admitting: General Practice

## 2021-04-11 ENCOUNTER — Other Ambulatory Visit: Payer: Medicaid Other

## 2021-04-11 ENCOUNTER — Other Ambulatory Visit (HOSPITAL_COMMUNITY): Payer: Self-pay

## 2021-04-11 DIAGNOSIS — Z91410 Personal history of adult physical and sexual abuse: Secondary | ICD-10-CM

## 2021-04-13 ENCOUNTER — Telehealth (INDEPENDENT_AMBULATORY_CARE_PROVIDER_SITE_OTHER): Payer: Self-pay | Admitting: *Deleted

## 2021-04-13 ENCOUNTER — Other Ambulatory Visit (HOSPITAL_COMMUNITY): Payer: Self-pay

## 2021-04-13 ENCOUNTER — Other Ambulatory Visit: Payer: Self-pay

## 2021-04-13 DIAGNOSIS — Z3A00 Weeks of gestation of pregnancy not specified: Secondary | ICD-10-CM

## 2021-04-13 DIAGNOSIS — O2340 Unspecified infection of urinary tract in pregnancy, unspecified trimester: Secondary | ICD-10-CM

## 2021-04-13 DIAGNOSIS — O09299 Supervision of pregnancy with other poor reproductive or obstetric history, unspecified trimester: Secondary | ICD-10-CM | POA: Insufficient documentation

## 2021-04-13 DIAGNOSIS — Z658 Other specified problems related to psychosocial circumstances: Secondary | ICD-10-CM | POA: Insufficient documentation

## 2021-04-13 DIAGNOSIS — O0990 Supervision of high risk pregnancy, unspecified, unspecified trimester: Secondary | ICD-10-CM

## 2021-04-13 DIAGNOSIS — F419 Anxiety disorder, unspecified: Secondary | ICD-10-CM
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
## 2021-04-13 NOTE — BH Specialist Note (Signed)
Pt did not arrive to video visit and did not answer the phone; Left HIPPA-compliant message to call back Jonahtan Manseau from [facility] at  [phone] (Avyana Puffenbarger's office).  ?; left MyChart message for patient.  ? ?

## 2021-04-13 NOTE — Patient Instructions (Signed)
  At our [facility] OB/GYN Practices, we work as an integrated team, providing care to address both physical and emotional health. Your medical provider may refer you to see our Behavioral Health Clinician [facility]) on the same day you see your medical provider, as availability permits; often scheduled virtually at your convenience.  Our [facility] is available to all patients, visits generally last between 20-30 minutes, but can be longer or shorter, depending on patient need. The [facility] offers help with stress management, coping with symptoms of depression and anxiety, major life changes , sleep issues, changing risky behavior, grief and loss, life stress, working on personal life goals, and  behavioral health issues, as these all affect your overall health and wellness.  The [facility] is NOT available for the following: FMLA paperwork, court-ordered evaluations, specialty assessments (custody or disability), letters to employers, or obtaining certification for an emotional support animal. The [facility] does not provide long-term therapy. You have the right to refuse integrated behavioral health services, or to reschedule to see the [facility] at a later date.  Confidentiality exception: If it is suspected that a child or disabled adult is being abused or neglected, we are required by law to report that to either Child Protective Services or Adult Management consultant.  If you have a diagnosis of Bipolar affective disorder, Schizophrenia, or recurrent Major depressive disorder, we will recommend that you establish care with a psychiatrist, as these are lifelong, chronic conditions, and we want your overall emotional health and medications to be more closely monitored. If you anticipate needing extended maternity leave due to mental health issues postpartum, it it recommended you inform your medical provider, so we can put in a referral to a psychiatrist as soon as possible. The [facility] is unable to recommend an extended maternity leave for mental  health issues. Your medical provider or [facility] may refer you to a therapist for ongoing, traditional therapy, or to a psychiatrist, for medication management, if it would benefit your overall health. Depending on your insurance, you may have a copay or be charged a deductible, depending on your insurance, to see the [facility]. If you are uninsured, it is recommended that you apply for financial assistance. (Forms may be requested at the front desk for in-person visits, via MyChart, or request a form during a virtual visit).  If you see the [facility] more than 6 times, you will have to complete a comprehensive clinical assessment interview with the [facility] to resume integrated services.  For virtual visits with the [facility], you must be physically in the state of [place] at the time of the visit. For example, if you live in [place], you will have to do an in-person visit with the [facility], and your out-of-state insurance may not cover behavioral health services in [place]. If you are going out of the state or country for any reason, the [facility] may see you virtually when you return to [place], but not while you are physically outside of [place].    Here is a link to the Pregnancy Navigators . Please Fill out prior to your New OB appointment.   English Link: [email or web address]  Spanish Link: [email or web address]   [email or web address] is a great resource for things you need to know for when you have your baby at the hospital

## 2021-04-15 ENCOUNTER — Other Ambulatory Visit (HOSPITAL_COMMUNITY): Payer: Self-pay

## 2021-04-17 ENCOUNTER — Ambulatory Visit: Payer: Self-pay | Admitting: Clinical

## 2021-04-17 DIAGNOSIS — Z5329 Procedure and treatment not carried out because of patient's decision for other reasons: Secondary | ICD-10-CM

## 2021-04-17 DIAGNOSIS — Z91199 Patient's noncompliance with other medical treatment and regimen due to unspecified reason: Secondary | ICD-10-CM

## 2021-04-25 ENCOUNTER — Encounter: Payer: Medicaid Other | Admitting: Family Medicine

## 2021-05-08 ENCOUNTER — Ambulatory Visit: Payer: Medicaid Other | Attending: Family Medicine

## 2021-05-08 ENCOUNTER — Ambulatory Visit: Payer: Medicaid Other

## 2021-05-11 ENCOUNTER — Other Ambulatory Visit: Payer: Self-pay

## 2021-05-11 ENCOUNTER — Emergency Department (HOSPITAL_COMMUNITY)
Admission: EM | Admit: 2021-05-11 | Discharge: 2021-05-11 | Disposition: A | Discharge status: Home / Self Care | Payer: Medicaid Other | Attending: Emergency Medicine | Admitting: Emergency Medicine

## 2021-05-11 ENCOUNTER — Encounter (HOSPITAL_COMMUNITY): Payer: Self-pay | Admitting: Emergency Medicine

## 2021-05-11 ENCOUNTER — Emergency Department (HOSPITAL_COMMUNITY): Payer: Medicaid Other

## 2021-05-11 DIAGNOSIS — Z5321 Procedure and treatment not carried out due to patient leaving prior to being seen by health care provider: Secondary | ICD-10-CM | POA: Insufficient documentation

## 2021-05-11 DIAGNOSIS — M25561 Pain in right knee: Secondary | ICD-10-CM | POA: Insufficient documentation

## 2021-05-11 NOTE — ED Provider Notes (Signed)
Emergency Medicine Provider Triage Evaluation Note  Ann Huerta , a 25 y.o. female  was evaluated in triage.  Pt complains of right knee pain.  Patient states that 2 days ago she was walking at work and her leg gave out.  Has been trying to manage with ice since without success.  Patient is ambulatory with pain.  Review of Systems  Positive: Right knee pain and swelling Negative:   Physical Exam  BP 94/60 (BP Location: Left Arm)   Pulse 90   Temp 98.9 F (37.2 C) (Oral)   Resp 18   LMP 01/04/2021 (Approximate)   SpO2 99%  Gen:   Awake, no distress  Resp:  Normal effort  MSK:   Moves extremities without difficulty Other:  Some edema and warmth noted to left knee. Dital pulses intact, no numbness or tingling  Medical Decision Making  Medically screening exam initiated at 3:24 PM.  Appropriate orders placed.  Ann Huerta was informed that the remainder of the evaluation will be completed by another provider, this initial triage assessment does not replace that evaluation, and the importance of remaining in the ED until their evaluation is complete.     Vear Clock 05/11/21 1528    Linwood Dibbles, MD 05/14/21 [phone]

## 2021-05-11 NOTE — ED Triage Notes (Signed)
Pt from work, complaining that knee gave out, pt ambulatory at triage.

## 2021-05-11 NOTE — ED Notes (Signed)
Pt did not respond when called for treatment area X3 inside and outside of lobby

## 2021-05-26 ENCOUNTER — Encounter: Payer: Medicaid Other | Admitting: Family Medicine

## 2021-06-14 ENCOUNTER — Encounter: Payer: Medicaid Other | Admitting: Family Medicine

## 2021-06-29 ENCOUNTER — Encounter: Payer: Medicaid Other | Admitting: Obstetrics & Gynecology

## 2021-07-12 ENCOUNTER — Other Ambulatory Visit: Payer: Medicaid Other

## 2021-07-12 ENCOUNTER — Other Ambulatory Visit: Payer: Self-pay

## 2021-07-12 DIAGNOSIS — Z91410 Personal history of adult physical and sexual abuse: Secondary | ICD-10-CM

## 2021-07-13 LAB — HIV ANTIBODY (ROUTINE TESTING W REFLEX): HIV Screen 4th Generation wRfx: NONREACTIVE

## 2021-07-17 ENCOUNTER — Telehealth: Payer: Self-pay | Admitting: Family Medicine
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
## 2021-07-21 ENCOUNTER — Encounter: Payer: Medicaid Other | Admitting: Advanced Practice Midwife

## 2021-08-01 ENCOUNTER — Encounter (HOSPITAL_COMMUNITY): Payer: Self-pay | Admitting: Emergency Medicine

## 2021-08-01 ENCOUNTER — Other Ambulatory Visit: Payer: Self-pay

## 2021-08-01 ENCOUNTER — Emergency Department (HOSPITAL_COMMUNITY)
Admission: EM | Admit: 2021-08-01 | Discharge: 2021-08-01 | Disposition: A | Discharge status: Home / Self Care | Payer: Self-pay | Attending: Emergency Medicine | Admitting: Emergency Medicine

## 2021-08-01 ENCOUNTER — Emergency Department (HOSPITAL_COMMUNITY): Payer: Self-pay

## 2021-08-01 DIAGNOSIS — O26893 Other specified pregnancy related conditions, third trimester: Secondary | ICD-10-CM | POA: Insufficient documentation

## 2021-08-01 DIAGNOSIS — M549 Dorsalgia, unspecified: Secondary | ICD-10-CM | POA: Insufficient documentation

## 2021-08-01 DIAGNOSIS — F17210 Nicotine dependence, cigarettes, uncomplicated: Secondary | ICD-10-CM | POA: Insufficient documentation

## 2021-08-01 DIAGNOSIS — Z3A31 31 weeks gestation of pregnancy: Secondary | ICD-10-CM | POA: Insufficient documentation

## 2021-08-01 DIAGNOSIS — R079 Chest pain, unspecified: Secondary | ICD-10-CM

## 2021-08-01 DIAGNOSIS — O99333 Smoking (tobacco) complicating pregnancy, third trimester: Secondary | ICD-10-CM | POA: Insufficient documentation

## 2021-08-01 DIAGNOSIS — R0789 Other chest pain: Secondary | ICD-10-CM | POA: Insufficient documentation

## 2021-08-01 HISTORY — DX: Encounter for supervision of normal pregnancy, unspecified, unspecified trimester: Z34.90

## 2021-08-01 LAB — HEPATIC FUNCTION PANEL
ALT: 11 U/L (ref 0–44)
AST: 13 U/L — ABNORMAL LOW (ref 15–41)
Albumin: 2.9 g/dL — ABNORMAL LOW (ref 3.5–5.0)
Alkaline Phosphatase: 76 U/L (ref 38–126)
Bilirubin, Direct: <0.1 mg/dL (ref 0.0–0.2)
Total Bilirubin: 0.2 mg/dL — ABNORMAL LOW (ref 0.3–1.2)
Total Protein: 6.2 g/dL — ABNORMAL LOW (ref 6.5–8.1)

## 2021-08-01 LAB — CBC
HCT: 32.3 % — ABNORMAL LOW (ref 36.0–46.0)
Hemoglobin: 10.8 g/dL — ABNORMAL LOW (ref 12.0–15.0)
MCH: 32.7 pg (ref 26.0–34.0)
MCHC: 33.4 g/dL (ref 30.0–36.0)
MCV: 97.9 fL (ref 80.0–100.0)
Platelets: 300 10*3/uL (ref 150–400)
RBC: 3.3 MIL/uL — ABNORMAL LOW (ref 3.87–5.11)
RDW: 14.6 % (ref 11.5–15.5)
WBC: 11.2 10*3/uL — ABNORMAL HIGH (ref 4.0–10.5)
nRBC: 0 % (ref 0.0–0.2)

## 2021-08-01 LAB — BASIC METABOLIC PANEL
Anion gap: 9 (ref 5–15)
BUN: 7 mg/dL (ref 6–20)
CO2: 22 mmol/L (ref 22–32)
Calcium: 8.4 mg/dL — ABNORMAL LOW (ref 8.9–10.3)
Chloride: 105 mmol/L (ref 98–111)
Creatinine, Ser: 0.68 mg/dL (ref 0.44–1.00)
GFR, Estimated: >60 mL/min (ref >60)
Glucose, Bld: 96 mg/dL (ref 70–99)
Potassium: 4 mmol/L (ref 3.5–5.1)
Sodium: 136 mmol/L (ref 135–145)

## 2021-08-01 LAB — URINALYSIS, ROUTINE W REFLEX MICROSCOPIC
Bilirubin Urine: NEGATIVE
Glucose, UA: NEGATIVE mg/dL
Hgb urine dipstick: NEGATIVE
Ketones, ur: NEGATIVE mg/dL
Nitrite: NEGATIVE
Protein, ur: NEGATIVE mg/dL
Specific Gravity, Urine: 1.015 (ref 1.005–1.030)
pH: 7 (ref 5.0–8.0)

## 2021-08-01 LAB — URINALYSIS, MICROSCOPIC (REFLEX)

## 2021-08-01 LAB — BRAIN NATRIURETIC PEPTIDE: B Natriuretic Peptide: 25.1 pg/mL (ref 0.0–100.0)

## 2021-08-01 LAB — TROPONIN I (HIGH SENSITIVITY): Troponin I (High Sensitivity): 5 ng/L (ref <18)

## 2021-08-01 MED ORDER — ALUM & MAG HYDROXIDE-SIMETH 200-200-20 MG/5ML PO SUSP
30.0000 mL | Freq: Once | ORAL | Status: AC
  Administered 2021-08-01: 30 mL via ORAL
  Filled 2021-08-01: qty 30

## 2021-08-01 MED ORDER — SUCRALFATE 1 G PO TABS
1.0000 g | ORAL_TABLET | Freq: Once | ORAL | Status: AC
  Administered 2021-08-01: 1 g via ORAL
  Filled 2021-08-01: qty 1

## 2021-08-01 NOTE — ED Triage Notes (Signed)
Pt. Stated, Im [redacted] weeks pregnant, Pt has not seen Dr at all during her pregnancy. [facility]

## 2021-08-01 NOTE — ED Provider Notes (Signed)
[facility] EMERGENCY DEPARTMENT Provider Note   CSN: [id number] Arrival date & time: 08/01/21  1024     History Chief Complaint  Patient presents with   Chest Pain   Back Pain    Ann Huerta is a 25 y.o. female.   Patient is a G2 P1 and approximately [redacted] weeks pregnant.  She has had no prenatal care but has a history of preeclampsia in her initial pregnancy.  She has been having 3 days of chest pain and back pain that has been constant. She states that the chest pain is worse when she coughs. She states that the pain has improved since it initially started, but it has not completely resolved.   She has had no shortness of breath, nausea, vomiting, abdominal pain, or leg swelling. She states that she is not having any concerns with baby.  Baby is still moving per normally.  No vaginal discharge or bleeding.   Chest Pain Associated symptoms: back pain   Associated symptoms: no abdominal pain, no cough, no dizziness, no fever, no headache, no nausea, no palpitations, no shortness of breath, no vomiting and no weakness   Back Pain Associated symptoms: chest pain   Associated symptoms: no abdominal pain, no dysuria, no fever, no headaches and no weakness    HPI: A 25 year old patient presents for evaluation of chest pain. Initial onset of pain was less than one hour ago. The patient's chest pain is not worse with exertion. The patient's chest pain is middle- or left-sided, is not well-localized, is not described as heaviness/pressure/tightness, is not sharp and does not radiate to the arms/jaw/neck. The patient does not complain of nausea and denies diaphoresis. The patient has no history of stroke, has no history of peripheral artery disease, has not smoked in the past 90 days, denies any history of treated diabetes, has no relevant family history of coronary artery disease (first degree relative at less than age 59), is not hypertensive, has no history of  hypercholesterolemia and does not have an elevated BMI (>=30).   Past Medical History:  Diagnosis Date   Complication of anesthesia    states still felt uc's with epidural   Pre-eclampsia    Pregnant    UTI (urinary tract infection) during pregnancy     Patient Active Problem List   Diagnosis Date Noted   Supervision of high risk pregnancy, antepartum 04/13/2021   History of pre-eclampsia in prior pregnancy, currently pregnant 04/13/2021   Psychosocial stressors 04/13/2021   Anxiety 04/13/2021   UTI (urinary tract infection) during pregnancy     No past surgical history on file.   OB History     Gravida  2   Para  1   Term  1   Preterm      AB      Living  1      SAB      IAB      Ectopic      Multiple      Live Births  1           Family History  Problem Relation Age of Onset   Multiple sclerosis Mother     Social History   Tobacco Use   Smoking status: Every Day    Packs/day: 0.25    Types: Cigarettes   Smokeless tobacco: Never  Vaping Use   Vaping Use: Never used  Substance Use Topics   Alcohol use: Never   Drug use: Yes  Frequency: 3.0 times per week    Types: Marijuana    Comment: trying to stop marijuana use    Home Medications Prior to Admission medications   Medication Sig Start Date End Date Taking? Authorizing Provider  Doxylamine-Pyridoxine 10-10 MG TBEC Take 1 tablet by mouth once daily. Patient not taking: Reported on 04/13/2021 04/06/21   Jeanell Sparrow, DO  elvitegravir-cobicistat-emtricitabine-tenofovir (GENVOYA) 150-150-200-10 MG TABS tablet Take 1 tablet by mouth daily with breakfast. 12/27/20   Henderly, Britni A, PA-C  oxyCODONE-acetaminophen (PERCOCET) 5-325 MG tablet Take 1-2 tablets by mouth every 4 (four) hours as needed. 10/22/20   Orpah Greek, MD    Allergies    Patient has no known allergies.  Review of Systems   Review of Systems  Constitutional:  Negative for chills and fever.  HENT:   Negative for congestion, rhinorrhea and sore throat.   Eyes:  Negative for visual disturbance.  Respiratory:  Negative for cough, chest tightness and shortness of breath.   Cardiovascular:  Positive for chest pain. Negative for palpitations and leg swelling.  Gastrointestinal:  Negative for abdominal pain, blood in stool, constipation, diarrhea, nausea and vomiting.  Genitourinary:  Negative for dysuria, flank pain and hematuria.  Musculoskeletal:  Positive for back pain.  Skin:  Negative for rash and wound.  Neurological:  Negative for dizziness, syncope, weakness, light-headedness and headaches.  Psychiatric/Behavioral:  Negative for confusion.   All other systems reviewed and are negative.  Physical Exam Updated Vital Signs BP 116/72 (BP Location: Left Arm)    Pulse 64    Temp 98.1 F (36.7 C) (Oral)    Resp 18    LMP 01/04/2021 (Approximate)    SpO2 100%   Physical Exam Vitals and nursing note reviewed.  Constitutional:      General: She is not in acute distress.    Appearance: Normal appearance. She is well-developed. She is not ill-appearing, toxic-appearing or diaphoretic.  HENT:     Head: Normocephalic and atraumatic.     Nose: No nasal deformity.     Mouth/Throat:     Lips: Pink. No lesions.     Mouth: Mucous membranes are moist. No injury, lacerations, oral lesions or angioedema.     Pharynx: Oropharynx is clear. Uvula midline. No pharyngeal swelling, oropharyngeal exudate, posterior oropharyngeal erythema or uvula swelling.  Eyes:     General: Gaze aligned appropriately. No scleral icterus.       Right eye: No discharge.        Left eye: No discharge.     Conjunctiva/sclera: Conjunctivae normal.     Right eye: Right conjunctiva is not injected. No exudate or hemorrhage.    Left eye: Left conjunctiva is not injected. No exudate or hemorrhage. Cardiovascular:     Rate and Rhythm: Normal rate and regular rhythm.     Pulses: Normal pulses.          Radial pulses are 2+  on the right side and 2+ on the left side.       Dorsalis pedis pulses are 2+ on the right side and 2+ on the left side.     Heart sounds: Normal heart sounds, S1 normal and S2 normal. Heart sounds not distant. No murmur heard.   No friction rub. No gallop. No S3 or S4 sounds.  Pulmonary:     Effort: Pulmonary effort is normal. No accessory muscle usage or respiratory distress.     Breath sounds: Normal breath sounds. No stridor. No wheezing, rhonchi or  rales.  Chest:     Chest wall: No tenderness.  Abdominal:     General: Abdomen is flat. Bowel sounds are normal. There is no distension.     Palpations: Abdomen is soft. There is no mass or pulsatile mass.     Tenderness: There is no abdominal tenderness. There is no guarding or rebound.     Comments: Gravid uterus  Musculoskeletal:     Cervical back: Normal range of motion. No tenderness.     Right lower leg: No edema.     Left lower leg: No edema.  Skin:    General: Skin is warm and dry.     Coloration: Skin is not jaundiced or pale.     Findings: No bruising, erythema, lesion or rash.  Neurological:     General: No focal deficit present.     Mental Status: She is alert and oriented to person, place, and time.     GCS: GCS eye subscore is 4. GCS verbal subscore is 5. GCS motor subscore is 6.  Psychiatric:        Mood and Affect: Mood normal.        Behavior: Behavior normal. Behavior is cooperative.    ED Results / Procedures / Treatments   Labs (all labs ordered are listed, but only abnormal results are displayed) Labs Reviewed  BASIC METABOLIC PANEL - Abnormal; Notable for the following components:      Result Value   Calcium 8.4 (*)    All other components within normal limits  CBC - Abnormal; Notable for the following components:   WBC 11.2 (*)    RBC 3.30 (*)    Hemoglobin 10.8 (*)    HCT 32.3 (*)    All other components within normal limits  HEPATIC FUNCTION PANEL - Abnormal; Notable for the following components:    Total Protein 6.2 (*)    Albumin 2.9 (*)    AST 13 (*)    Total Bilirubin 0.2 (*)    All other components within normal limits  URINALYSIS, ROUTINE W REFLEX MICROSCOPIC - Abnormal; Notable for the following components:   Leukocytes,Ua SMALL (*)    All other components within normal limits  URINALYSIS, MICROSCOPIC (REFLEX) - Abnormal; Notable for the following components:   Bacteria, UA RARE (*)    All other components within normal limits  BRAIN NATRIURETIC PEPTIDE  TROPONIN I (HIGH SENSITIVITY)    EKG None  Radiology DG Chest 2 View  Result Date: 08/01/2021 CLINICAL DATA:  Chest pain EXAM: CHEST - 2 VIEW COMPARISON:  None. FINDINGS: The cardiomediastinal silhouette is within normal limits. There is no focal airspace consolidation. There is no pleural effusion. There is no pneumothorax. There is a chronic left clavicle injury. There is a anterior bowing deformity of the sternum with cortical step-off seen. IMPRESSION: No evidence of acute cardiopulmonary disease. 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 Correlate with clinical history of trauma, as there is evidence of an old healed left clavicle fracture as well. Electronically Signed   By: Maurine Simmering M.D.   On: 08/01/2021 13:22    Procedures Procedures   Medications Ordered in ED Medications  alum & mag hydroxide-simeth (MAALOX/MYLANTA) 200-200-20 MG/5ML suspension 30 mL (30 mLs Oral Given 08/01/21 1356)  sucralfate (CARAFATE) tablet 1 g (1 g Oral Given 08/01/21 1359)    ED Course  I have reviewed the triage vital signs and the nursing notes.  Pertinent labs &  imaging results that were available during my care of the patient were reviewed by me and considered in my medical decision making (see chart for details).  Clinical Course as of 08/01/21 1740  Tue Aug 01, 2021  1105 G2p1 31 w pregnant. No prenatal care. Hx of preeclampsia. Three  days of chest pain that radiates to the back. Stable. [GL]  1107 Spoke with MAU. She should be evaluated for cardiac etiologies prior to be sent over to MAU. We can call rapid response OB to have baby checked over here.  [GL]  1506 HEART score 0, Wells 0 [GL]    Clinical Course User Index [GL] Jakeem Grape, Finis Bud, PA-C   MDM Rules/Calculators/A&P HEAR Score: 0                        This is a G2 P8 31-week pregnant female with a past medical history of preeclampsia, and no prenatal care who presents the emergency department with epigastric chest pain and back pain.  This is a 25 y.o. female, G2 P62, 31-week pregnant female with a past medical history of preeclampsia, and no prenatal care who presents the emergency department with epigastric chest pain and back pain.   Vitals:  stable. Afebrile. No tachycardia. Normotensive. Oxygenating at 100% on RA.  Patient is well appearing and in no acute distress. Exam with reproducible chest wall tenderness. Gravid uterus present with no abdominal pain. Otherwise unremarkable.   I personally reviewed all laboratory work and imaging. Abnormal results outlined below. - Minimal leukocytosis which is a normal finding in pregnancy. Stable anemia. BMP unremarkable. Troponin normal. Liver enzymes unremarkable. UA with no evidence of protein. Cxr with possible concern for sternal fracture given the clinical picture,  however with no evidence of trauma, this is unlikely. EKG with RBBB.   Interpretation: Unremarkable labs. I do not feel that this is representative of ACS with HEART score of 0. WELLs 0 so low suspicion for PE. Likely GI related since GI cocktail worked.   Events/Interventions: GI cocktail provided  On reassessment, patient's symptoms have improved. Her labs and imaging were overall reassuring. I do not feel that we need to pursue ultrasound or fetal monitoring while patient is in the ED since symptoms resolved, no evidence of preeclampsia, baby with  normal fetal movements, and patient's vitals are stable.   Dispo Plan: Follow up with OBGYN for prenatal care. Return precautions.   Portions of this note were generated with Scientist, clinical (histocompatibility and immunogenetics). Dictation errors may occur despite best attempts at proofreading.   Final Clinical Impression(s) / ED Diagnoses Final diagnoses:  Nonspecific chest pain    Rx / DC Orders ED Discharge Orders     None        Claudie Leach, PA-C 08/01/21 1740    Mancel Bale, MD 08/02/21 1135

## 2021-08-01 NOTE — ED Provider Notes (Signed)
Emergency Medicine Provider Triage Evaluation Note  Ann Huerta , a 25 y.o. female  was evaluated in triage.  Patient is a G2 P1 [redacted] weeks pregnant.  She has had no prenatal care but has a history of preeclampsia in her initial pregnancy.  She has been having 3 days of chest pain that radiates to her back and is been constant.  She has had no shortness of breath, nausea, vomiting, abdominal pain. She states that she is not having any concerns with baby.  Baby is still moving per normally.  No vaginal discharge or bleeding. Review of Systems  Positive: Chest pain, back pain Negative:   Physical Exam  BP 125/77    Pulse 89    Temp 97.7 F (36.5 C) (Oral)    Resp 14    LMP 01/04/2021 (Approximate)    SpO2 100%  Gen:   Awake, no distress   Resp:  Normal effort  MSK:   Moves extremities without difficulty  Other:    Medical Decision Making  Medically screening exam initiated at 11:10 AM.  Appropriate orders placed.  Lavonna Lampron was informed that the remainder of the evaluation will be completed by another provider, this initial triage assessment does not replace that evaluation, and the importance of remaining in the ED until their evaluation is complete.     Therese Sarah 08/01/21 1112    Mancel Bale, MD 08/01/21 [phone]

## 2021-08-01 NOTE — ED Triage Notes (Signed)
Pt. Stated, I started having chest pain and back pain 3 days ago. It has been constant, Denies any other symptoms.

## 2021-08-01 NOTE — Discharge Instructions (Addendum)
You were seen in the emergency department for chest pain.  We have found no emergent causes to your pain.  Please set up an appointment as soon as possible to be seen and evaluated by an OB/GYN.

## 2021-08-18 ENCOUNTER — Encounter: Payer: Medicaid Other | Admitting: Family Medicine

## 2021-08-20 NOTE — L&D Delivery Note (Signed)
OB/GYN Faculty Practice Delivery Note  Ann Huerta is a 26 y.o. G2P1001 s/p SVD at [redacted]w[redacted]d. She was admitted for IOL for severe FGR.   ROM: 2h 81m with clear fluid GBS Status: negative Maximum Maternal Temperature: 98.2  Labor Progress: Presented for IOL, was found to be 2 cm and 70%effaced and started on pitocin. Then patient was AROMed and she progressed to complete  Delivery Date/Time: 2028 on 09/23/2021 Delivery: Called to room and patient was complete and pushing. Head delivered LOA. Nuchal cord present and delivered through. Shoulder and body delivered in usual fashion. Infant with spontaneous cry, placed on mother's abdomen, dried and stimulated. Cord clamped x 2 after 1-minute delay, and cut by father of baby. Cord blood drawn. Placenta delivered spontaneously with gentle cord traction. Fundus firm with massage and Pitocin. Labia, perineum, vagina, and cervix inspected and found to have no lacerations.  Placenta: intact, 3V cord, small in appearance, given severe FGR sent to path Complications: none Lacerations: none EBL: 50 Analgesia: epidural   Infant: female   APGARs 8,9   weight pending  Warner Mccreedy, MD, MPH OB Fellow, Faculty Practice Center for [facility], [facility] 09/23/2021, 8:54 PM

## 2021-09-05 ENCOUNTER — Encounter: Payer: Self-pay | Admitting: Family Medicine

## 2021-09-05 ENCOUNTER — Other Ambulatory Visit (HOSPITAL_COMMUNITY)
Admission: RE | Admit: 2021-09-05 | Discharge: 2021-09-05 | Disposition: A | Discharge status: Home / Self Care | Payer: Medicaid Other | Source: Ambulatory Visit | Attending: Family Medicine | Admitting: Family Medicine

## 2021-09-05 ENCOUNTER — Other Ambulatory Visit: Payer: Self-pay

## 2021-09-05 ENCOUNTER — Ambulatory Visit (INDEPENDENT_AMBULATORY_CARE_PROVIDER_SITE_OTHER): Payer: Medicaid Other | Admitting: Family Medicine

## 2021-09-05 VITALS — BP 127/85 | HR 74 | Wt 166.2 lb

## 2021-09-05 DIAGNOSIS — O0990 Supervision of high risk pregnancy, unspecified, unspecified trimester: Secondary | ICD-10-CM | POA: Insufficient documentation

## 2021-09-05 DIAGNOSIS — O09299 Supervision of pregnancy with other poor reproductive or obstetric history, unspecified trimester: Secondary | ICD-10-CM | POA: Diagnosis not present

## 2021-09-05 LAB — OB RESULTS CONSOLE GC/CHLAMYDIA: Gonorrhea: NEGATIVE
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
## 2021-09-06 ENCOUNTER — Encounter: Payer: Self-pay | Admitting: *Deleted

## 2021-09-06 ENCOUNTER — Other Ambulatory Visit: Payer: Self-pay | Admitting: *Deleted

## 2021-09-06 ENCOUNTER — Ambulatory Visit (HOSPITAL_BASED_OUTPATIENT_CLINIC_OR_DEPARTMENT_OTHER): Payer: Medicaid Other | Admitting: Obstetrics and Gynecology

## 2021-09-06 ENCOUNTER — Ambulatory Visit: Payer: Medicaid Other | Admitting: *Deleted

## 2021-09-06 ENCOUNTER — Other Ambulatory Visit: Payer: Self-pay | Admitting: Family Medicine

## 2021-09-06 ENCOUNTER — Ambulatory Visit: Payer: Medicaid Other | Attending: Family Medicine

## 2021-09-06 VITALS — BP 124/78 | HR 76

## 2021-09-06 DIAGNOSIS — Z3689 Encounter for other specified antenatal screening: Secondary | ICD-10-CM

## 2021-09-06 DIAGNOSIS — F419 Anxiety disorder, unspecified: Secondary | ICD-10-CM | POA: Insufficient documentation

## 2021-09-06 DIAGNOSIS — O0990 Supervision of high risk pregnancy, unspecified, unspecified trimester: Secondary | ICD-10-CM

## 2021-09-06 DIAGNOSIS — I499 Cardiac arrhythmia, unspecified: Secondary | ICD-10-CM | POA: Diagnosis not present

## 2021-09-06 DIAGNOSIS — O365930 Maternal care for other known or suspected poor fetal growth, third trimester, not applicable or unspecified: Secondary | ICD-10-CM | POA: Insufficient documentation

## 2021-09-06 DIAGNOSIS — O0933 Supervision of pregnancy with insufficient antenatal care, third trimester: Secondary | ICD-10-CM | POA: Insufficient documentation

## 2021-09-06 DIAGNOSIS — O09293 Supervision of pregnancy with other poor reproductive or obstetric history, third trimester: Secondary | ICD-10-CM

## 2021-09-06 DIAGNOSIS — O09299 Supervision of pregnancy with other poor reproductive or obstetric history, unspecified trimester: Secondary | ICD-10-CM | POA: Diagnosis present

## 2021-09-06 DIAGNOSIS — O99330 Smoking (tobacco) complicating pregnancy, unspecified trimester: Secondary | ICD-10-CM

## 2021-09-06 DIAGNOSIS — O365990 Maternal care for other known or suspected poor fetal growth, unspecified trimester, not applicable or unspecified: Secondary | ICD-10-CM

## 2021-09-06 DIAGNOSIS — Z3A36 36 weeks gestation of pregnancy: Secondary | ICD-10-CM | POA: Diagnosis present

## 2021-09-06 DIAGNOSIS — Z658 Other specified problems related to psychosocial circumstances: Secondary | ICD-10-CM | POA: Insufficient documentation

## 2021-09-06 DIAGNOSIS — O99413 Diseases of the circulatory system complicating pregnancy, third trimester: Secondary | ICD-10-CM

## 2021-09-06 LAB — CBC/D/PLT+RPR+RH+ABO+RUBIGG...
Antibody Screen: NEGATIVE
Basophils Absolute: 0 10*3/uL (ref 0.0–0.2)
Basos: 1 %
EOS (ABSOLUTE): 0 10*3/uL (ref 0.0–0.4)
Eos: 1 %
HCV Ab: <0.1 s/co ratio (ref 0.0–0.9)
HIV Screen 4th Generation wRfx: NONREACTIVE
Hematocrit: 32.2 % — ABNORMAL LOW (ref 34.0–46.6)
Hemoglobin: 11.3 g/dL (ref 11.1–15.9)
Hepatitis B Surface Ag: NEGATIVE
Immature Grans (Abs): 0.1 10*3/uL (ref 0.0–0.1)
Immature Granulocytes: 1 %
Lymphocytes Absolute: 1.8 10*3/uL (ref 0.7–3.1)
Lymphs: 20 %
MCH: 32.9 pg (ref 26.6–33.0)
MCHC: 35.1 g/dL (ref 31.5–35.7)
MCV: 94 fL (ref 79–97)
Monocytes Absolute: 0.5 10*3/uL (ref 0.1–0.9)
Monocytes: 6 %
Neutrophils Absolute: 6.2 10*3/uL (ref 1.4–7.0)
Neutrophils: 71 %
Platelets: 299 10*3/uL (ref 150–450)
RBC: 3.43 x10E6/uL — ABNORMAL LOW (ref 3.77–5.28)
RDW: 14.5 % (ref 11.7–15.4)
RPR Ser Ql: NONREACTIVE
Rh Factor: POSITIVE
Rubella Antibodies, IGG: 1.83 index (ref >0.99)
WBC: 8.6 10*3/uL (ref 3.4–10.8)

## 2021-09-06 LAB — GC/CHLAMYDIA PROBE AMP (~~LOC~~) NOT AT ARMC
Chlamydia: NEGATIVE
Comment: NEGATIVE
Comment: NORMAL
Neisseria Gonorrhea: NEGATIVE

## 2021-09-06 LAB — HCV INTERPRETATION

## 2021-09-06 LAB — HEMOGLOBIN A1C
Est. average glucose Bld gHb Est-mCnc: 114 mg/dL
Hgb A1c MFr Bld: 5.6 % (ref 4.8–5.6)

## 2021-09-06 NOTE — Progress Notes (Signed)
Maternal-Fetal Medicine   Name: Ann Huerta DOB: January 25, 1996 MRN: [id number] Referring Provider: Clayton Lefort, MD  I had the pleasure of seeing Ms. Bankey today at the [facility]. She is G2 P1 at 36w 4d gestation with inadequate prenatal care and is here for ultrasound evaluation of her pregnancy.  Although patient has her last menstrual period date, she reports irregular cycles.  Her pregnancy was dated by 14-week ultrasound performed at our radiology department.  However, only BPD measurement was obtained to date her pregnancy.  Obstetric history: In 2008 she had a term vaginal delivery of a female infant weighing 6 pounds and 10 ounces at birth.  Her pregnancy was complicated by preeclampsia.  Past medical history: She does not have hypertension or diabetes. Social history: Patient smokes about 5 cigarettes daily and uses marijuana occasionally.  She does not use alcohol or any other drugs.  Her partner is [place], and he is in good health.   Patient has not had screening for fetal aneuploidies.  She is here to screen for gestational diabetes.  Hemoglobin A1c is within normal range (5.6%).  Ultrasound On today's ultrasound, the estimated fetal weight is at less than the 1st percentile.  Head circumference measurement is at between -2 and -3 SD.  Abdominal and femur length measurements are at the 1st percentile.  Amniotic fluid is normal and good fetal activity seen.  Fetal heart arrhythmia is consistent with premature atrial contractions are seen.  Fetal anatomical survey appears normal but limited by advanced gestational age.  Antenatal testing is reassuring.  Umbilical artery Doppler showed normal forward diastolic flow.  NST is reactive.  BPP 10/10.  Fetal growth restriction -I explained the finding of fetal growth restriction.  Even if her pregnancy is dated by last menstrual period, the estimated fetal weight will be at the 2nd percentile. -I explained  the possible causes of fetal growth restriction including placental insufficiency (most common cause), fetal chromosomal anomalies or genetic syndromes, fetal infections (no history of fever or rashes) or unknown causes. I discussed smoking cessation.  Alternatively, she may use nicotine replacement therapy (patches).  Patient is planning to quit smoking. I explained ultrasound monitoring of fetal growth restriction.  Because of late gestational age, unsure pregnancy dating and the finding of severe fetal growth restriction, I have recommended twice weekly antenatal testing. Timing of delivery: Because of her uncertain dates, delivery at [redacted] weeks gestation may result in a premature infant.  I recommend continuing antenatal testing twice weekly and deliver 2 weeks from now. Patient reports she will be unable to come for another visit this week.  Fetal heart arrhythmia I explained that premature atrial contractions are not associated with adverse outcomes.  I have recommended that she reduce her caffeinated drinks.  Recommendations -BPP, NST and UA Doppler on 09/12/2021. -NST on 09/15/2021. -BPP, NST and UA Doppler on 09/19/2021. -Delivery in the first week of February (38 to 39 weeks' gestation dated by 14-week ultrasound).  Thank you for consultation.  If you have any questions or concerns, please contact me the [facility].  Consultation including face-to-face (more than 50%) counseling 30 minutes.

## 2021-09-06 NOTE — Procedures (Signed)
Ann Huerta March 24, 1996 [redacted]w[redacted]d  Fetus A Non-Stress Test Interpretation for 09/06/21  Indication: IUGR  Fetal Heart Rate A Mode: External Baseline Rate (A): 115 bpm Variability: Moderate Accelerations: 15 x 15 Decelerations: None Multiple birth?: No  Uterine Activity Mode: Palpation, Toco Contraction Frequency (min): None Resting Tone Palpated: Relaxed Resting Time: Adequate  Interpretation (Fetal Testing) Nonstress Test Interpretation: Reactive Comments: Dr. Judeth Cornfield reviewed tracing.

## 2021-09-07 ENCOUNTER — Encounter: Payer: Self-pay | Admitting: Family Medicine

## 2021-09-07 DIAGNOSIS — O365990 Maternal care for other known or suspected poor fetal growth, unspecified trimester, not applicable or unspecified: Secondary | ICD-10-CM | POA: Insufficient documentation

## 2021-09-07 LAB — URINE CULTURE, OB REFLEX

## 2021-09-07 LAB — CULTURE, OB URINE

## 2021-09-09 LAB — CULTURE, BETA STREP (GROUP B ONLY): Strep Gp B Culture: NEGATIVE

## 2021-09-12 ENCOUNTER — Encounter: Payer: Self-pay | Admitting: Family Medicine

## 2021-09-12 ENCOUNTER — Encounter: Payer: Self-pay | Admitting: *Deleted

## 2021-09-12 ENCOUNTER — Ambulatory Visit: Payer: Medicaid Other | Admitting: *Deleted

## 2021-09-12 ENCOUNTER — Ambulatory Visit: Payer: Medicaid Other | Attending: Obstetrics and Gynecology

## 2021-09-12 ENCOUNTER — Ambulatory Visit (INDEPENDENT_AMBULATORY_CARE_PROVIDER_SITE_OTHER): Payer: Medicaid Other | Admitting: Family Medicine

## 2021-09-12 ENCOUNTER — Other Ambulatory Visit: Payer: Self-pay

## 2021-09-12 VITALS — BP 136/76 | HR 84

## 2021-09-12 VITALS — BP 121/85 | HR 85 | Wt 168.6 lb

## 2021-09-12 DIAGNOSIS — O365930 Maternal care for other known or suspected poor fetal growth, third trimester, not applicable or unspecified: Secondary | ICD-10-CM

## 2021-09-12 DIAGNOSIS — I499 Cardiac arrhythmia, unspecified: Secondary | ICD-10-CM

## 2021-09-12 DIAGNOSIS — O09299 Supervision of pregnancy with other poor reproductive or obstetric history, unspecified trimester: Secondary | ICD-10-CM | POA: Diagnosis present

## 2021-09-12 DIAGNOSIS — Z658 Other specified problems related to psychosocial circumstances: Secondary | ICD-10-CM | POA: Diagnosis present

## 2021-09-12 DIAGNOSIS — F419 Anxiety disorder, unspecified: Secondary | ICD-10-CM

## 2021-09-12 DIAGNOSIS — O99413 Diseases of the circulatory system complicating pregnancy, third trimester: Secondary | ICD-10-CM

## 2021-09-12 DIAGNOSIS — O0990 Supervision of high risk pregnancy, unspecified, unspecified trimester: Secondary | ICD-10-CM

## 2021-09-12 DIAGNOSIS — O365931 Maternal care for other known or suspected poor fetal growth, third trimester, fetus 1: Secondary | ICD-10-CM | POA: Insufficient documentation

## 2021-09-12 DIAGNOSIS — Z3A37 37 weeks gestation of pregnancy: Secondary | ICD-10-CM

## 2021-09-12 DIAGNOSIS — O09293 Supervision of pregnancy with other poor reproductive or obstetric history, third trimester: Secondary | ICD-10-CM

## 2021-09-12 DIAGNOSIS — O365990 Maternal care for other known or suspected poor fetal growth, unspecified trimester, not applicable or unspecified: Secondary | ICD-10-CM

## 2021-09-12 NOTE — Progress Notes (Signed)
° °  Subjective:  Miche Briere is a 26 y.o. G2P1001 at [redacted]w[redacted]d being seen today for ongoing prenatal care.  She is currently monitored for the following issues for this high-risk pregnancy and has Supervision of high risk pregnancy, antepartum; History of pre-eclampsia in prior pregnancy, currently pregnant; Psychosocial stressors; Anxiety; UTI (urinary tract infection) during pregnancy; and IUGR (intrauterine growth restriction) affecting care of mother on their problem list.  Patient reports no complaints.  Contractions: Irritability. Vag. Bleeding: None.  Movement: Present. Denies leaking of fluid.   The following portions of the patient's history were reviewed and updated as appropriate: allergies, current medications, past family history, past medical history, past social history, past surgical history and problem list. Problem list updated.  Objective:   Vitals:   09/12/21 1641  BP: 121/85  Pulse: 85  Weight: 168 lb 9.6 oz (76.5 kg)    Fetal Status: Fetal Heart Rate (bpm): 133   Movement: Present     General:  Alert, oriented and cooperative. Patient is in no acute distress.  Skin: Skin is warm and dry. No rash noted.   Cardiovascular: Normal heart rate noted  Respiratory: Normal respiratory effort, no problems with respiration noted  Abdomen: Soft, gravid, appropriate for gestational age. Pain/Pressure: Present     Pelvic: Vag. Bleeding: None     Cervical exam deferred        Extremities: Normal range of motion.  Edema: None  Mental Status: Normal mood and affect. Normal behavior. Normal judgment and thought content.   Urinalysis:      Assessment and Plan:  Pregnancy: G2P1001 at [redacted]w[redacted]d  1. Supervision of high risk pregnancy, antepartum BP and FHR normal Labs normal from last visit - Ambulatory referral to [place]  2. History of pre-eclampsia in prior pregnancy, currently pregnant BP normal today  3. Psychosocial stressors FOB no longer  involved Has no family or other supports Referred to navigator for resources  4. Poor fetal growth affecting management of mother in third trimester, single or unspecified fetus <1% MFM recommend IOL first week of February given uncertain dates IOL scheduled for 09/22/2021 Form faxed, orders placed  Preterm labor symptoms and general obstetric precautions including but not limited to vaginal bleeding, contractions, leaking of fluid and fetal movement were reviewed in detail with the patient. Please refer to After Visit Summary for other counseling recommendations.  Return in 1 week (on 09/19/2021) for [facility], ob visit, needs MD.   Clarnce Flock, MD

## 2021-09-12 NOTE — Procedures (Signed)
Ann Huerta 1996-01-04 [redacted]w[redacted]d  Fetus A Non-Stress Test Interpretation for 09/12/21  Indication: IUGR  Fetal Heart Rate A Mode: External Baseline Rate (A): 125 bpm Variability: Moderate Accelerations: 15 x 15 Decelerations: None Multiple birth?: No  Uterine Activity Mode: Palpation, Toco Contraction Frequency (min): UI with 1 prolonged UC Contraction Quality: Mild Resting Tone Palpated: Relaxed Resting Time: Adequate  Interpretation (Fetal Testing) Nonstress Test Interpretation: Reactive Comments: Dr. Annamaria Boots reviewed tracing.

## 2021-09-12 NOTE — Patient Instructions (Signed)

## 2021-09-13 ENCOUNTER — Telehealth (HOSPITAL_COMMUNITY): Payer: Self-pay | Admitting: *Deleted

## 2021-09-13 ENCOUNTER — Encounter: Payer: Self-pay | Admitting: *Deleted

## 2021-09-13 NOTE — BH Specialist Note (Deleted)
Integrated Behavioral Health via Telemedicine Visit  09/13/2021 Ann Huerta [id number]  Number of Integrated Behavioral Health visits: 1 Session Start time: 10:15***  Session End time: 11:15*** Total time: {IBH Total Time:21014050}  Referring Provider: *** Patient/Family location: Home*** [facility] Provider location: [facility]  All persons participating in visit: Patient *** and [facility] Leevi Cullars ***  Types of Service: {CHL AMB TYPE OF SERVICE:3325976157}  I connected with [name] and/or [name]'s {family members:20773} via  Telephone or Video Enabled Telemedicine Application  (Video is Caregility application) and verified that I am speaking with the correct person using two identifiers. Discussed confidentiality: {YES/NO:21197}  I discussed the limitations of telemedicine and the availability of in person appointments.  Discussed there is a possibility of technology failure and discussed alternative modes of communication if that failure occurs.  I discussed that engaging in this telemedicine visit, they consent to the provision of behavioral healthcare and the services will be billed under their insurance.  Patient and/or legal guardian expressed understanding and consented to Telemedicine visit: {YES/NO:21197}  Presenting Concerns: Patient and/or family reports the following symptoms/concerns: *** Duration of problem: ***; Severity of problem: {Mild/Moderate/Severe:20260}  Patient and/or Family's Strengths/Protective Factors: {CHL AMB BH PROTECTIVE FACTORS:[phone]}  Goals Addressed: Patient will:  Reduce symptoms of: {IBH Symptoms:21014056}   Increase knowledge and/or ability of: {IBH Patient Tools:21014057}   Demonstrate ability to: {IBH Goals:21014053}  Progress towards Goals: {CHL AMB BH PROGRESS TOWARDS GOALS:[phone]}  Interventions: Interventions utilized:  {IBH  Interventions:21014054} Standardized Assessments completed: {IBH Screening Tools:21014051}  Patient and/or Family Response: ***  Assessment: Patient currently experiencing ***.   Patient may benefit from ***.  Plan: Follow up with behavioral health clinician on : *** Behavioral recommendations: *** Referral(s): {IBH Referrals:21014055}  I discussed the assessment and treatment plan with the patient and/or parent/guardian. They were provided an opportunity to ask questions and all were answered. They agreed with the plan and demonstrated an understanding of the instructions.   They were advised to call back or seek an in-person evaluation if the symptoms worsen or if the condition fails to improve as anticipated.  Valetta Close Phillippa Straub, LCSW

## 2021-09-13 NOTE — Telephone Encounter (Signed)
Preadmission screen  

## 2021-09-14 ENCOUNTER — Telehealth (HOSPITAL_COMMUNITY): Payer: Self-pay | Admitting: *Deleted

## 2021-09-14 NOTE — Telephone Encounter (Signed)
Preadmission screen  

## 2021-09-15 ENCOUNTER — Other Ambulatory Visit: Payer: Self-pay | Admitting: Advanced Practice Midwife

## 2021-09-15 ENCOUNTER — Ambulatory Visit: Payer: Medicaid Other | Admitting: *Deleted

## 2021-09-15 ENCOUNTER — Encounter: Payer: Self-pay | Admitting: *Deleted

## 2021-09-15 ENCOUNTER — Ambulatory Visit: Payer: Medicaid Other

## 2021-09-15 ENCOUNTER — Other Ambulatory Visit: Payer: Self-pay

## 2021-09-15 ENCOUNTER — Telehealth (HOSPITAL_COMMUNITY): Payer: Self-pay | Admitting: *Deleted

## 2021-09-15 ENCOUNTER — Ambulatory Visit: Payer: Medicaid Other | Attending: Obstetrics and Gynecology | Admitting: *Deleted

## 2021-09-15 VITALS — BP 112/71 | HR 70

## 2021-09-15 DIAGNOSIS — Z658 Other specified problems related to psychosocial circumstances: Secondary | ICD-10-CM

## 2021-09-15 DIAGNOSIS — O365930 Maternal care for other known or suspected poor fetal growth, third trimester, not applicable or unspecified: Secondary | ICD-10-CM

## 2021-09-15 DIAGNOSIS — Z3A37 37 weeks gestation of pregnancy: Secondary | ICD-10-CM | POA: Insufficient documentation

## 2021-09-15 DIAGNOSIS — O365990 Maternal care for other known or suspected poor fetal growth, unspecified trimester, not applicable or unspecified: Secondary | ICD-10-CM | POA: Insufficient documentation

## 2021-09-15 DIAGNOSIS — F419 Anxiety disorder, unspecified: Secondary | ICD-10-CM

## 2021-09-15 DIAGNOSIS — O0990 Supervision of high risk pregnancy, unspecified, unspecified trimester: Secondary | ICD-10-CM

## 2021-09-15 DIAGNOSIS — O09299 Supervision of pregnancy with other poor reproductive or obstetric history, unspecified trimester: Secondary | ICD-10-CM

## 2021-09-15 NOTE — Procedures (Signed)
Ann Huerta [birth year] [redacted]w[redacted]d  Fetus A Non-Stress Test Interpretation for 09/15/21  Indication: IUGR  Fetal Heart Rate A Mode: External Baseline Rate (A): 120 bpm Variability: Moderate Accelerations: 15 x 15 Decelerations: None Multiple birth?: No  Uterine Activity Mode: Palpation, Toco Contraction Frequency (min): ui Resting Tone Palpated: Relaxed  Interpretation (Fetal Testing) Nonstress Test Interpretation: Reactive Overall Impression: Reassuring for gestational age Comments: Dr. Judeth Cornfield reviewed tracing

## 2021-09-15 NOTE — Telephone Encounter (Signed)
Preadmission screen  

## 2021-09-18 ENCOUNTER — Telehealth (HOSPITAL_COMMUNITY): Payer: Self-pay | Admitting: *Deleted

## 2021-09-18 NOTE — Telephone Encounter (Signed)
Preadmission screen  

## 2021-09-19 ENCOUNTER — Ambulatory Visit (HOSPITAL_BASED_OUTPATIENT_CLINIC_OR_DEPARTMENT_OTHER): Payer: Medicaid Other | Admitting: Maternal & Fetal Medicine

## 2021-09-19 ENCOUNTER — Ambulatory Visit (HOSPITAL_BASED_OUTPATIENT_CLINIC_OR_DEPARTMENT_OTHER): Payer: Medicaid Other | Admitting: *Deleted

## 2021-09-19 ENCOUNTER — Ambulatory Visit: Payer: Medicaid Other

## 2021-09-19 ENCOUNTER — Ambulatory Visit: Payer: Medicaid Other | Admitting: *Deleted

## 2021-09-19 ENCOUNTER — Other Ambulatory Visit: Payer: Self-pay

## 2021-09-19 ENCOUNTER — Ambulatory Visit: Payer: Medicaid Other | Attending: Obstetrics and Gynecology

## 2021-09-19 ENCOUNTER — Ambulatory Visit (INDEPENDENT_AMBULATORY_CARE_PROVIDER_SITE_OTHER): Payer: Self-pay | Admitting: Family Medicine

## 2021-09-19 ENCOUNTER — Telehealth (HOSPITAL_COMMUNITY): Payer: Self-pay | Admitting: *Deleted

## 2021-09-19 VITALS — BP 122/84 | HR 91

## 2021-09-19 DIAGNOSIS — O365930 Maternal care for other known or suspected poor fetal growth, third trimester, not applicable or unspecified: Secondary | ICD-10-CM

## 2021-09-19 DIAGNOSIS — O0990 Supervision of high risk pregnancy, unspecified, unspecified trimester: Secondary | ICD-10-CM | POA: Insufficient documentation

## 2021-09-19 DIAGNOSIS — Z3A38 38 weeks gestation of pregnancy: Secondary | ICD-10-CM

## 2021-09-19 DIAGNOSIS — O365910 Maternal care for other known or suspected poor fetal growth, first trimester, not applicable or unspecified: Secondary | ICD-10-CM | POA: Insufficient documentation

## 2021-09-19 DIAGNOSIS — O09299 Supervision of pregnancy with other poor reproductive or obstetric history, unspecified trimester: Secondary | ICD-10-CM | POA: Insufficient documentation

## 2021-09-19 DIAGNOSIS — F419 Anxiety disorder, unspecified: Secondary | ICD-10-CM | POA: Diagnosis present

## 2021-09-19 DIAGNOSIS — O2340 Unspecified infection of urinary tract in pregnancy, unspecified trimester: Secondary | ICD-10-CM

## 2021-09-19 DIAGNOSIS — Z658 Other specified problems related to psychosocial circumstances: Secondary | ICD-10-CM | POA: Diagnosis present

## 2021-09-19 DIAGNOSIS — O365990 Maternal care for other known or suspected poor fetal growth, unspecified trimester, not applicable or unspecified: Secondary | ICD-10-CM | POA: Insufficient documentation

## 2021-09-19 NOTE — Procedures (Signed)
Ann Huerta 1995-09-27 [redacted]w[redacted]d  Fetus A Non-Stress Test Interpretation for 09/19/21  Indication: IUGR  Fetal Heart Rate A Mode: External Baseline Rate (A): 120 bpm Variability: Moderate Accelerations: 15 x 15 Decelerations: None Multiple birth?: No  Uterine Activity Mode: Palpation, Toco Contraction Frequency (min): 1 UC Contraction Quality: Mild Resting Tone Palpated: Relaxed Resting Time: Adequate  Interpretation (Fetal Testing) Nonstress Test Interpretation: Reactive Comments: Dr. Gertie Exon reviewed tracing.

## 2021-09-19 NOTE — Progress Notes (Signed)
Patient arrived late, went to MFM first. Reportedly had normal antenatal testing, did not stop by our office for prenatal visit afterwards. Has IOL scheduled for later this week.

## 2021-09-19 NOTE — Telephone Encounter (Signed)
Preadmission screen  

## 2021-09-19 NOTE — Progress Notes (Signed)
MFM Brief Note  Ms. Garvie is a 30 weeks today and is here for antenatal testing at the request of Dr. Clayton Lefort.   Antenatal testing with a prior EFW  and AC at <1%. Biophysical profile 10/10 UA Dopplers are normal without evidence of AEDF or REDF. There is good fetal movement and amniotic fluid volume.  Ms. Thien reports good fetal movement as well and was instructed on performing daily kick counts.  Typically we would recommend delivery at 37 weeks for severe fetal growth restriction, however, after speaking with Dr. Donalee Citrin, he was uncomfortable dating the pregnancy based on limited biometry of a BPD.  Ms. Antonacci is scheduled for delivery on Friday.  All questions answered.   I spent 15 minutes with > 50% in face to face consultation.  Vikki Ports, MD.

## 2021-09-20 ENCOUNTER — Encounter (HOSPITAL_COMMUNITY): Payer: Self-pay | Admitting: Family Medicine
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
## 2021-09-22 ENCOUNTER — Inpatient Hospital Stay (HOSPITAL_COMMUNITY): Payer: Medicaid Other

## 2021-09-22 DIAGNOSIS — F419 Anxiety disorder, unspecified: Secondary | ICD-10-CM

## 2021-09-22 DIAGNOSIS — O09299 Supervision of pregnancy with other poor reproductive or obstetric history, unspecified trimester: Secondary | ICD-10-CM

## 2021-09-22 DIAGNOSIS — O2340 Unspecified infection of urinary tract in pregnancy, unspecified trimester: Secondary | ICD-10-CM

## 2021-09-22 DIAGNOSIS — Z658 Other specified problems related to psychosocial circumstances: Secondary | ICD-10-CM

## 2021-09-22 DIAGNOSIS — O0990 Supervision of high risk pregnancy, unspecified, unspecified trimester: Secondary | ICD-10-CM

## 2021-09-22 NOTE — Progress Notes (Signed)
Patient scheduled for AM induction on 09/22/2021.  Unable to reach patient.  She has been called 3 times this morning ([phone], [phone], [phone]) with voice message to contact [phone] regarding her induction.

## 2021-09-23 ENCOUNTER — Inpatient Hospital Stay (HOSPITAL_COMMUNITY): Payer: Medicaid Other | Attending: Family Medicine

## 2021-09-23 ENCOUNTER — Other Ambulatory Visit: Payer: Self-pay

## 2021-09-23 ENCOUNTER — Inpatient Hospital Stay (HOSPITAL_COMMUNITY): Payer: Medicaid Other | Admitting: Anesthesiology

## 2021-09-23 ENCOUNTER — Encounter (HOSPITAL_COMMUNITY): Payer: Self-pay | Admitting: Family Medicine

## 2021-09-23 ENCOUNTER — Inpatient Hospital Stay (HOSPITAL_COMMUNITY)
Admission: AD | Admit: 2021-09-23 | Discharge: 2021-09-25 | DRG: 807 | Disposition: A | Discharge status: Home / Self Care | Payer: Medicaid Other | Attending: Obstetrics and Gynecology | Admitting: Obstetrics and Gynecology

## 2021-09-23 DIAGNOSIS — O365930 Maternal care for other known or suspected poor fetal growth, third trimester, not applicable or unspecified: Secondary | ICD-10-CM | POA: Diagnosis present

## 2021-09-23 DIAGNOSIS — F17210 Nicotine dependence, cigarettes, uncomplicated: Secondary | ICD-10-CM | POA: Diagnosis present

## 2021-09-23 DIAGNOSIS — O99334 Smoking (tobacco) complicating childbirth: Secondary | ICD-10-CM | POA: Diagnosis present

## 2021-09-23 DIAGNOSIS — Z3A39 39 weeks gestation of pregnancy: Secondary | ICD-10-CM | POA: Diagnosis not present

## 2021-09-23 DIAGNOSIS — Z20822 Contact with and (suspected) exposure to covid-19: Secondary | ICD-10-CM | POA: Diagnosis present

## 2021-09-23 DIAGNOSIS — F419 Anxiety disorder, unspecified: Secondary | ICD-10-CM

## 2021-09-23 DIAGNOSIS — O2340 Unspecified infection of urinary tract in pregnancy, unspecified trimester: Secondary | ICD-10-CM

## 2021-09-23 DIAGNOSIS — O09299 Supervision of pregnancy with other poor reproductive or obstetric history, unspecified trimester: Secondary | ICD-10-CM

## 2021-09-23 DIAGNOSIS — Z658 Other specified problems related to psychosocial circumstances: Secondary | ICD-10-CM

## 2021-09-23 DIAGNOSIS — O0990 Supervision of high risk pregnancy, unspecified, unspecified trimester: Secondary | ICD-10-CM

## 2021-09-23 DIAGNOSIS — O365990 Maternal care for other known or suspected poor fetal growth, unspecified trimester, not applicable or unspecified: Secondary | ICD-10-CM

## 2021-09-23 HISTORY — DX: Anxiety disorder, unspecified: F41.9

## 2021-09-23 LAB — TYPE AND SCREEN
ABO/RH(D): A POS
Antibody Screen: NEGATIVE

## 2021-09-23 LAB — CBC
HCT: 34.6 % — ABNORMAL LOW (ref 36.0–46.0)
Hemoglobin: 11.7 g/dL — ABNORMAL LOW (ref 12.0–15.0)
MCH: 32.1 pg (ref 26.0–34.0)
MCHC: 33.8 g/dL (ref 30.0–36.0)
MCV: 94.8 fL (ref 80.0–100.0)
Platelets: 315 10*3/uL (ref 150–400)
RBC: 3.65 MIL/uL — ABNORMAL LOW (ref 3.87–5.11)
RDW: 15.7 % — ABNORMAL HIGH (ref 11.5–15.5)
WBC: 7.2 10*3/uL (ref 4.0–10.5)
nRBC: 0 % (ref 0.0–0.2)

## 2021-09-23 LAB — RESP PANEL BY RT-PCR (FLU A&B, COVID) ARPGX2
Influenza A by PCR: NEGATIVE
Influenza B by PCR: NEGATIVE
SARS Coronavirus 2 by RT PCR: NEGATIVE

## 2021-09-23 MED ORDER — COCONUT OIL OIL: 1.0000 "application " | TOPICAL_OIL | Status: DC | PRN

## 2021-09-23 MED ORDER — FENTANYL-BUPIVACAINE-NACL 0.5-0.125-0.9 MG/250ML-% EP SOLN
12.0000 mL/h | EPIDURAL | Status: DC | PRN
  Administered 2021-09-23: 12 mL/h via EPIDURAL
  Filled 2021-09-23: qty 250

## 2021-09-23 MED ORDER — TETANUS-DIPHTH-ACELL PERTUSSIS 5-2.5-18.5 LF-MCG/0.5 IM SUSY: 0.5000 mL | PREFILLED_SYRINGE | Freq: Once | INTRAMUSCULAR | Status: DC

## 2021-09-23 MED ORDER — MEDROXYPROGESTERONE ACETATE 150 MG/ML IM SUSP: 150.0000 mg | INTRAMUSCULAR | Status: DC | PRN

## 2021-09-23 MED ORDER — TERBUTALINE SULFATE 1 MG/ML IJ SOLN: 0.2500 mg | Freq: Once | INTRAMUSCULAR | Status: DC | PRN

## 2021-09-23 MED ORDER — SENNOSIDES-DOCUSATE SODIUM 8.6-50 MG PO TABS
2.0000 | ORAL_TABLET | Freq: Every day | ORAL | Status: DC
  Filled 2021-09-23 (×2): qty 2

## 2021-09-23 MED ORDER — SOD CITRATE-CITRIC ACID 500-334 MG/5ML PO SOLN: 30.0000 mL | ORAL | Status: DC | PRN

## 2021-09-23 MED ORDER — OXYTOCIN BOLUS FROM INFUSION
333.0000 mL | Freq: Once | INTRAVENOUS | Status: AC
  Administered 2021-09-23: 333 mL via INTRAVENOUS

## 2021-09-23 MED ORDER — MEASLES, MUMPS & RUBELLA VAC IJ SOLR: 0.5000 mL | Freq: Once | INTRAMUSCULAR | Status: DC

## 2021-09-23 MED ORDER — DIPHENHYDRAMINE HCL 25 MG PO CAPS: 25.0000 mg | ORAL_CAPSULE | Freq: Four times a day (QID) | ORAL | Status: DC | PRN

## 2021-09-23 MED ORDER — SIMETHICONE 80 MG PO CHEW: 80.0000 mg | CHEWABLE_TABLET | ORAL | Status: DC | PRN

## 2021-09-23 MED ORDER — FENTANYL CITRATE (PF) 100 MCG/2ML IJ SOLN
100.0000 ug | INTRAMUSCULAR | Status: DC | PRN
  Administered 2021-09-23: 100 ug via INTRAVENOUS
  Filled 2021-09-23: qty 2

## 2021-09-23 MED ORDER — OXYTOCIN-SODIUM CHLORIDE 30-0.9 UT/500ML-% IV SOLN
1.0000 m[IU]/min | INTRAVENOUS | Status: DC
  Administered 2021-09-23: 2 m[IU]/min via INTRAVENOUS
  Filled 2021-09-23: qty 500

## 2021-09-23 MED ORDER — EPHEDRINE 5 MG/ML INJ: 10.0000 mg | INTRAVENOUS | Status: DC | PRN

## 2021-09-23 MED ORDER — LIDOCAINE HCL (PF) 1 % IJ SOLN: 30.0000 mL | INTRAMUSCULAR | Status: DC | PRN

## 2021-09-23 MED ORDER — ACETAMINOPHEN 325 MG PO TABS
650.0000 mg | ORAL_TABLET | ORAL | Status: DC | PRN
  Administered 2021-09-24: 650 mg via ORAL
  Filled 2021-09-23: qty 2

## 2021-09-23 MED ORDER — ONDANSETRON HCL 4 MG/2ML IJ SOLN: 4.0000 mg | Freq: Four times a day (QID) | INTRAMUSCULAR | Status: DC | PRN

## 2021-09-23 MED ORDER — LACTATED RINGERS IV SOLN: 500.0000 mL | INTRAVENOUS | Status: DC | PRN

## 2021-09-23 MED ORDER — LACTATED RINGERS IV SOLN: INTRAVENOUS | Status: DC

## 2021-09-23 MED ORDER — PRENATAL MULTIVITAMIN CH
1.0000 | ORAL_TABLET | Freq: Every day | ORAL | Status: DC
  Administered 2021-09-25: 1 via ORAL
  Filled 2021-09-23 (×2): qty 1

## 2021-09-23 MED ORDER — ONDANSETRON HCL 4 MG PO TABS: 4.0000 mg | ORAL_TABLET | ORAL | Status: DC | PRN

## 2021-09-23 MED ORDER — WITCH HAZEL-GLYCERIN EX PADS: 1.0000 "application " | MEDICATED_PAD | CUTANEOUS | Status: DC | PRN

## 2021-09-23 MED ORDER — LIDOCAINE HCL (PF) 1 % IJ SOLN
INTRAMUSCULAR | Status: DC | PRN
  Administered 2021-09-23: 6 mL via EPIDURAL

## 2021-09-23 MED ORDER — PHENYLEPHRINE 40 MCG/ML (10ML) SYRINGE FOR IV PUSH (FOR BLOOD PRESSURE SUPPORT): 80.0000 ug | PREFILLED_SYRINGE | INTRAVENOUS | Status: DC | PRN

## 2021-09-23 MED ORDER — LACTATED RINGERS IV SOLN: 500.0000 mL | Freq: Once | INTRAVENOUS | Status: DC

## 2021-09-23 MED ORDER — OXYTOCIN-SODIUM CHLORIDE 30-0.9 UT/500ML-% IV SOLN: 2.5000 [IU]/h | INTRAVENOUS | Status: DC

## 2021-09-23 MED ORDER — ACETAMINOPHEN 325 MG PO TABS: 650.0000 mg | ORAL_TABLET | ORAL | Status: DC | PRN

## 2021-09-23 MED ORDER — IBUPROFEN 600 MG PO TABS
600.0000 mg | ORAL_TABLET | Freq: Four times a day (QID) | ORAL | Status: DC
  Administered 2021-09-24 – 2021-09-25 (×7): 600 mg via ORAL
  Filled 2021-09-23 (×7): qty 1

## 2021-09-23 MED ORDER — PHENYLEPHRINE 40 MCG/ML (10ML) SYRINGE FOR IV PUSH (FOR BLOOD PRESSURE SUPPORT)
80.0000 ug | PREFILLED_SYRINGE | INTRAVENOUS | Status: DC | PRN
  Filled 2021-09-23: qty 10

## 2021-09-23 MED ORDER — DIPHENHYDRAMINE HCL 50 MG/ML IJ SOLN: 12.5000 mg | INTRAMUSCULAR | Status: DC | PRN

## 2021-09-23 MED ORDER — ONDANSETRON HCL 4 MG/2ML IJ SOLN: 4.0000 mg | INTRAMUSCULAR | Status: DC | PRN

## 2021-09-23 MED ORDER — BENZOCAINE-MENTHOL 20-0.5 % EX AERO: 1.0000 "application " | INHALATION_SPRAY | CUTANEOUS | Status: DC | PRN

## 2021-09-23 MED ORDER — DIBUCAINE (PERIANAL) 1 % EX OINT: 1.0000 "application " | TOPICAL_OINTMENT | CUTANEOUS | Status: DC | PRN
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
## 2021-09-23 NOTE — Progress Notes (Signed)
Labor Progress Note Ann Huerta is a 26 y.o. G2P1001 at [redacted]w[redacted]d who presented for IOL due to FGR.  S: Doing well. Feeling her contractions more, rating them about a 6/10 in severity. No concerns at this time.  O:  BP 123/75    Pulse (!) 59    Temp 98 F (36.7 C) (Oral)    Resp 20    Ht 5\' 3"  (1.6 m)    Wt 76.5 kg    LMP 01/04/2021 (Approximate)    BMI 29.88 kg/m   EFM: Baseline 120 bpm, moderate variability, + accels, no decels  Toco: Every 2-3 minutes  CVE: Dilation: 3 Effacement (%): 70 Station: -2 Presentation: Vertex Exam by:: Dr [email or web address]   A&P: 26 y.o. G2P1001 [redacted]w[redacted]d   #Labor: Progressing well. Discussed AROM and patient verbally consented. Performed with small amount of clear fluid. Fetal head well applied. Mom and baby tolerated well. Will continue Pitocin and reassess in 4 hours. #Pain: PRN; planning for epidural when ready  #FWB: Cat 1 #GBS negative  [redacted]w[redacted]d, MD 6:21 PM

## 2021-09-23 NOTE — Discharge Summary (Signed)
Postpartum Discharge Summary     Patient Name: Ann Huerta DOB: 23-Sep-1995 MRN: [id number]  Date of admission: 09/23/2021 Delivery date:09/23/2021  Delivering provider: Renard Matter  Date of discharge: 09/25/2021  Admitting diagnosis: IUGR (intrauterine growth restriction) affecting care of mother [O36.5990] Intrauterine pregnancy: [redacted]w[redacted]d    Secondary diagnosis:  Principal Problem:   IUGR (intrauterine growth restriction) affecting care of mother Active Problems:   Supervision of high risk pregnancy, antepartum   History of pre-eclampsia in prior pregnancy, currently pregnant   Vaginal delivery  Additional problems: None    Discharge diagnosis: Term Pregnancy Delivered                                              Post partum procedures:   None Augmentation: AROM and Pitocin Complications: None  Hospital course: Induction of Labor With Vaginal Delivery   26y.o. yo G2P1001 at 343w0das admitted to the hospital 09/23/2021 for induction of labor.  Indication for induction:  severe FGR .  Patient had an uncomplicated labor course as follows: Membrane Rupture Time/Date: 6:02 PM ,09/23/2021   Delivery Method:Vaginal, Spontaneous  Episiotomy: None  Lacerations:  None  Details of delivery can be found in separate delivery note.  Patient had a routine postpartum course. She had more than three BP with diastolics greater than 85 so was started on Procardia 3049mPatient is discharged home 09/25/21.  Newborn Data: Birth date:09/23/2021  Birth time:8:28 PM  Gender:Female  Living status:Living  Apgars:8 ,9  Weight:2580 g   Magnesium Sulfate received: No BMZ received: No Rhophylac:N/A MMR:N/A T-DaP: declined Flu: declined Transfusion:No  Physical exam  Vitals:   09/24/21 1526 09/24/21 2055 09/24/21 2147 09/25/21 0617  BP: 121/84 (!) 124/94 121/87 125/89  Pulse: (!) 52 (!) 54 66 (!) 59  Resp: '18 20  18  ' Temp: (!) 97.5 F (36.4 C) 98.2 F (36.8 C)  98.1 F (36.7 C)  TempSrc:  Oral Oral  Oral  SpO2: 100% 100%  99%  Weight:      Height:       General: alert Lochia: appropriate Uterine Fundus: firm Incision: N/A DVT Evaluation: No evidence of DVT seen on physical exam. Labs: Lab Results  Component Value Date   WBC 7.2 09/23/2021   HGB 11.7 (L) 09/23/2021   HCT 34.6 (L) 09/23/2021   MCV 94.8 09/23/2021   PLT 315 09/23/2021   CMP Latest Ref Rng & Units 08/01/2021  Glucose 70 - 99 mg/dL 96  BUN 6 - 20 mg/dL 7  Creatinine 0.44 - 1.00 mg/dL 0.68  Sodium 135 - 145 mmol/L 136  Potassium 3.5 - 5.1 mmol/L 4.0  Chloride 98 - 111 mmol/L 105  CO2 22 - 32 mmol/L 22  Calcium 8.9 - 10.3 mg/dL 8.4(L)  Total Protein 6.5 - 8.1 g/dL 6.2(L)  Total Bilirubin 0.3 - 1.2 mg/dL 0.2(L)  Alkaline Phos 38 - 126 U/L 76  AST 15 - 41 U/L 13(L)  ALT 0 - 44 U/L 11   Edinburgh Score: No flowsheet data found.   After visit meds:  Allergies as of 09/25/2021   No Known Allergies      Medication List     TAKE these medications    acetaminophen 325 MG tablet Commonly known as: Tylenol Take 2 tablets (650 mg total) by mouth every 4 (four) hours as needed (for  pain scale < 4).   ibuprofen 600 MG tablet Commonly known as: ADVIL Take 1 tablet (600 mg total) by mouth every 6 (six) hours.   NIFEdipine 30 MG 24 hr tablet Commonly known as: ADALAT CC Take 1 tablet (30 mg total) by mouth daily.         Discharge home in stable condition Infant Feeding: Breast Infant Disposition:home with mother Discharge instruction: per After Visit Summary and Postpartum booklet. Activity: Advance as tolerated. Pelvic rest for 6 weeks.  Diet: routine diet Future Appointments: Future Appointments  Date Time Provider [place]  09/27/2021  2:45 PM [facility]  10/27/2021 10:35 AM Clarnce Flock, MD [facility]   Follow up Visit: Message sent to [facility] by Dr. Cy Blamer on 09/23/2021  Please schedule this patient for a In person postpartum visit in 4  weeks with the following provider: Any provider. Additional Postpartum F/U: 6 wk PP IUD and 1 week BP check   High risk pregnancy complicated by:  FGR Delivery mode:  Vaginal, Spontaneous  Anticipated Birth Control:   outpatient IUD   Renard Matter, MD, MPH OB Fellow, [facility]

## 2021-09-23 NOTE — Anesthesia Preprocedure Evaluation (Addendum)
Anesthesia Evaluation  Patient identified by MRN, date of birth, ID band Patient awake    Reviewed: Allergy & Precautions, NPO status , Patient's Chart, lab work & pertinent test results  Airway Mallampati: II  TM Distance: >3 FB Neck ROM: Full    Dental no notable dental hx.    Pulmonary neg pulmonary ROS, Current Smoker,    Pulmonary exam normal breath sounds clear to auscultation       Cardiovascular hypertension, negative cardio ROS Normal cardiovascular exam Rhythm:Regular Rate:Normal     Neuro/Psych PSYCHIATRIC DISORDERS Anxiety negative neurological ROS     GI/Hepatic negative GI ROS, (+)     substance abuse  marijuana use,   Endo/Other  negative endocrine ROS  Renal/GU negative Renal ROS  negative genitourinary   Musculoskeletal negative musculoskeletal ROS (+)   Abdominal   Peds negative pediatric ROS (+)  Hematology  (+) Blood dyscrasia, anemia ,   Anesthesia Other Findings   Reproductive/Obstetrics (+) Pregnancy                            Anesthesia Physical Anesthesia Plan  ASA: 2  Anesthesia Plan: Epidural   Post-op Pain Management:    Induction:   PONV Risk Score and Plan: 1 and Treatment may vary due to age or medical condition  Airway Management Planned: Natural Airway  Additional Equipment: None  Intra-op Plan:   Post-operative Plan: Extubation in OR  Informed Consent: I have reviewed the patients History and Physical, chart, labs and discussed the procedure including the risks, benefits and alternatives for the proposed anesthesia with the patient or authorized representative who has indicated his/her understanding and acceptance.       Plan Discussed with: Anesthesiologist  Anesthesia Plan Comments:         Anesthesia Quick Evaluation

## 2021-09-23 NOTE — Anesthesia Procedure Notes (Signed)
Epidural Patient location during procedure: OB Start time: 09/23/2021 7:42 PM End time: 09/23/2021 7:52 PM  Staffing Anesthesiologist: Mellody Dance, MD Performed: anesthesiologist   Preanesthetic Checklist Completed: patient identified, IV checked, site marked, risks and benefits discussed, monitors and equipment checked, pre-op evaluation and timeout performed  Epidural Patient position: sitting Prep: DuraPrep Patient monitoring: heart rate, cardiac monitor, continuous pulse ox and blood pressure Approach: midline Location: L2-L3 Injection technique: LOR saline  Needle:  Needle type: Tuohy  Needle gauge: 17 G Needle length: 9 cm Needle insertion depth: 4 cm Catheter type: closed end flexible Catheter size: 20 Guage Catheter at skin depth: 9 cm Test dose: negative and Other  Assessment Events: blood not aspirated, injection not painful, no injection resistance and negative IV test  Additional Notes Informed consent obtained prior to proceeding including risk of failure, 1% risk of PDPH, risk of minor discomfort and bruising.  Discussed rare but serious complications including epidural abscess, permanent nerve injury, epidural hematoma.  Discussed alternatives to epidural analgesia and patient desires to proceed.  Timeout performed pre-procedure verifying patient name, procedure, and platelet count.  Patient tolerated procedure well.

## 2021-09-23 NOTE — Lactation Note (Signed)
This note was copied from a baby's chart. Lactation Consultation Note  Patient Name: Girl Berneita Sanagustin MCNOB'S Date: 09/23/2021 Reason for consult: L&D Initial assessment Age:26 hours P2, term female infant. Mom's feeding choice is breast and formula feeding. See Maternal data below regarding mom's past breastfeeding history.  LC entered the room, infant was cuing to breast feed. Mom latched infant on her right breast using the football hold position, infant was on and off breast for 23 minutes. LC observed mom has large nipples but infant opens mouth wide and will latch without difficulty. LC encouraged mom to offer support and remove hand off of infant's head while latching at the breast. Mom knows to ask RN/LC for further latch assistance on MBU. Mom knows to breastfeed infant according to primal cues, 8 to 12 times within 24 hours, skin to skin. LC congratulated family on the birth of their daughter.  Maternal Data Does the patient have breastfeeding experience prior to this delivery?: Yes How long did the patient breastfeed?: Per mom, she had latch difficulties with her son, she breast and fomula feed infant up to 1 month before soley formula feeding infant.  Feeding Mother's Current Feeding Choice: Breast Milk and Formula  LATCH Score Latch: Repeated attempts needed to sustain latch, nipple held in mouth throughout feeding, stimulation needed to elicit sucking reflex.  Audible Swallowing: A few with stimulation  Type of Nipple: Everted at rest and after stimulation  Comfort (Breast/Nipple): Soft / non-tender  Hold (Positioning): Full assist, staff holds infant at breast  LATCH Score: 6   Lactation Tools Discussed/Used    Interventions Interventions: Assisted with latch;Skin to skin;Breast compression;Adjust position;Support pillows;Position options;Education  Discharge    Consult Status Consult Status: Follow-up from L&D Date: 09/24/21 Follow-up type:  In-patient    Danelle Earthly 09/23/2021, 9:44 PM

## 2021-09-24 LAB — RPR: RPR Ser Ql: NONREACTIVE

## 2021-09-24 NOTE — Lactation Note (Signed)
This note was copied from a baby's chart. Lactation Consultation Note  Patient Name: Ann Huerta [id number] Date: 09/24/2021 Reason for consult: Initial assessment;Term Age:26 hours   P2 mother whose infant is now 78 hours old.  This is a term baby at 39+0 weeks weighing < 6 lbs. Mother breast fed her first child for one month.  Her current feeding choice is breast/formula.  Baby "Ann Huerta" was awake and quiet when I arrived.  Changed a large green stool.  Taught mother hand expression and she was able to return demonstrate.  Finger fed colostrum drops to baby.  Assisted to latch in the football hold, however, "Ann Huerta" was not interested in initiating a suck.  Breast compressions and gentle stimulation demonstrated.  Placed her STS and she fell asleep on mother's chest.   Encouraged to feed on cue and call for latch assistance as needed.  Father present.   Maternal Data Has patient been taught Hand Expression?: Yes Does the patient have breastfeeding experience prior to this delivery?: Yes How long did the patient breastfeed?: 1 month  Feeding Mother's Current Feeding Choice: Breast Milk and Formula Nipple Type: Slow - flow  LATCH Score Latch: Too sleepy or reluctant, no latch achieved, no sucking elicited.  Audible Swallowing: None  Type of Nipple: Everted at rest and after stimulation  Comfort (Breast/Nipple): Soft / non-tender  Hold (Positioning): Assistance needed to correctly position infant at breast and maintain latch.  LATCH Score: 5   Lactation Tools Discussed/Used    Interventions Interventions: Breast feeding basics reviewed;Assisted with latch;Skin to skin;Breast massage;Hand express;Position options;Support pillows;Adjust position;Education;LC Services brochure  Discharge Pump: Personal (Plans to call insurance company to determine eligibility for a DEBP) [place] Program: Yes  Consult Status Consult Status: Follow-up Date: 09/25/21 Follow-up type:  In-patient    Amand Lemoine R Falesha Schommer 09/24/2021, 4:15 AM

## 2021-09-24 NOTE — Anesthesia Postprocedure Evaluation (Signed)
Anesthesia Post Note  Patient: Ann Huerta  Procedure(s) Performed: AN AD HOC LABOR EPIDURAL     Patient location during evaluation: Mother Baby Anesthesia Type: Epidural Level of consciousness: awake and alert Pain management: pain level controlled Vital Signs Assessment: post-procedure vital signs reviewed and stable Respiratory status: spontaneous breathing, nonlabored ventilation and respiratory function stable Cardiovascular status: stable Postop Assessment: no headache, no backache, epidural receding, no apparent nausea or vomiting, patient able to bend at knees, adequate PO intake and able to ambulate Anesthetic complications: no   No notable events documented.  Last Vitals:  Vitals:   09/24/21 0335 09/24/21 0749  BP: 121/71 106/64  Pulse: 77 60  Resp: 20 18  Temp: 36.8 C 36.7 C  SpO2: 99% 99%    Last Pain:  Vitals:   09/24/21 0754  TempSrc:   PainSc: 0-No pain   Pain Goal: Patients Stated Pain Goal: 2 (09/24/21 0538)                 [name]

## 2021-09-24 NOTE — Progress Notes (Signed)
CSW acknowledged consult and attempted to meet with MOB; however, MOB was in the shower. CSW will check back in at a later time.  ° °Haya Hemler, LCSW °Clinical Social Worker °[facility] °Cell#: [phone] °

## 2021-09-24 NOTE — Progress Notes (Signed)
Notified Dr. Ephriam Jenkins of pt's BPs this evening. 124/94 at 2055, 121/87 at 2147. Pt denies S&S PIH. Dr. Ephriam Jenkins to review chart.
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
## 2021-09-24 NOTE — Progress Notes (Signed)
POSTPARTUM PROGRESS NOTE  Subjective: Ann Huerta is a 26 y.o. [id number] PPD#1 s/p SVD at [redacted]w[redacted]d.  She reports she doing well. No acute events overnight. She denies any problems with ambulating, voiding or po intake. Denies nausea or vomiting. She has  passed flatus. Pain is well controlled.  Lochia is scant.  Objective: Blood pressure 120/79, pulse (!) 111, temperature 98 F (36.7 C), temperature source Oral, resp. rate 18, height 5\' 3"  (1.6 m), weight 76.5 kg, last menstrual period 01/04/2021, SpO2 100 %, unknown if currently breastfeeding.  Physical Exam:  General: alert, cooperative and no distress Chest: no respiratory distress Abdomen: soft, non-tender  Uterine Fundus: firm, appropriately tender Extremities: No calf swelling or tenderness   no edema  Recent Labs    09/23/21 1245  HGB 11.7*  HCT 34.6*    Assessment/Plan: Ann Huerta is a 26 y.o. 22 PPD#1 s/p SVD at [redacted]w[redacted]d.  Routine Postpartum Care: Doing well, pain well-controlled.  -- Continue routine care, lactation support  -- Contraception: planning outpatient IUD -- Feeding: breast  #Social stressors Patient with transportation barriers. SW consulted  Dispo: Plan for discharge PPD#2 since late/evening delivery.  [redacted]w[redacted]d, MD, MPH OB Fellow, [facility] for [facility]

## 2021-09-24 NOTE — Lactation Note (Signed)
This note was copied from a baby's chart. Lactation Consultation Note LC asked mom if she would like LC to set up DEBP since baby is less than 6 lbs it would provide more stimulation to the breast, mom stated yes. Mom shown how to use DEBP & how to disassemble, clean, & reassemble parts. Mom knows to pump q3h for 15-20 min.  LC demonstrated how to use hand pump attachment and colostrum flowed from breast. Praised mom and suggested mom pump at this time but mom politely declined at this time. Mom encouraged to feed baby 8-12 times/24 hours and with feeding cues.   Encouraged mom to put baby to the breast to BF before supplementing. Mom stated she is BF/formula, BF, or just formula. Its different each time. Encouraged mom to call for assistance if needed.  Patient Name: Girl Natiya Seelinger [id number] Date: 09/24/2021 Reason for consult: Follow-up assessment Age:26 hours  Maternal Data    Feeding Mother's Current Feeding Choice: Breast Milk and Formula  LATCH Score       Type of Nipple: Everted at rest and after stimulation  Comfort (Breast/Nipple): Soft / non-tender         Lactation Tools Discussed/Used Tools: Pump Breast pump type: Double-Electric Breast Pump Pump Education: Setup, frequency, and cleaning;Milk Storage Reason for Pumping: less than 6 lbs Pumping frequency: Q 3hr  Interventions Interventions: DEBP  Discharge    Consult Status Consult Status: Follow-up Date: 09/25/21 Follow-up type: In-patient    Charyl Dancer 09/24/2021, 10:39 PM

## 2021-09-24 NOTE — Lactation Note (Signed)
This note was copied from a baby's chart. Lactation Consultation Note Stopped by to see if mom wanted LC to set up DEBP for her to use. Mom in shower. Told FOB LC would come back.  Patient Name: Girl Dusti Tetro [id number] Date: 09/24/2021   Age:26 hours  Maternal Data    Feeding    LATCH Score                    Lactation Tools Discussed/Used    Interventions    Discharge    Consult Status      Charyl Dancer 09/24/2021, 8:29 PM

## 2021-09-25 ENCOUNTER — Other Ambulatory Visit (HOSPITAL_COMMUNITY): Payer: Self-pay

## 2021-09-25 MED ORDER — ACETAMINOPHEN 325 MG PO TABS
650.0000 mg | ORAL_TABLET | ORAL | 0 refills | Status: DC | PRN
  Filled 2021-09-25: qty 60, 5d supply, fill #0

## 2021-09-25 MED ORDER — NIFEDIPINE ER OSMOTIC RELEASE 30 MG PO TB24
30.0000 mg | ORAL_TABLET | Freq: Every day | ORAL | Status: DC
  Administered 2021-09-25: 30 mg via ORAL
  Filled 2021-09-25: qty 1

## 2021-09-25 MED ORDER — NIFEDIPINE ER 30 MG PO TB24
30.0000 mg | ORAL_TABLET | Freq: Every day | ORAL | 0 refills | Status: AC
  Filled 2021-09-25: qty 30, 30d supply, fill #0

## 2021-09-25 MED ORDER — IBUPROFEN 600 MG PO TABS
600.0000 mg | ORAL_TABLET | Freq: Four times a day (QID) | ORAL | 0 refills | Status: AC
Start: 2021-09-25 — End: ?
  Filled 2021-09-25: qty 30, 8d supply, fill #0

## 2021-09-25 NOTE — Social Work (Signed)
[facility] CPS case assigned to [employer] [phone].   CSW attempted to contact CPS SW, awaiting follow-up. Barriers to discharge.   Manfred Arch, MSW, [employer] Clinical Social Work [employer] and [employer] [phone]

## 2021-09-26 ENCOUNTER — Ambulatory Visit: Payer: Self-pay

## 2021-09-26 LAB — SURGICAL PATHOLOGY

## 2021-09-26 NOTE — Lactation Note (Signed)
This note was copied from a baby's chart. Lactation Consultation Note  Patient Name: Ann Huerta NIDPO'E Date: 09/26/2021 Reason for consult: Follow-up assessment Age:26 hours  P2, Mother states she is now only formula feeding. Reviewed engorgement care and using cabbage leaves.  Feeding Mother's Current Feeding Choice: Formula Nipple Type: Slow - flow  Discharge Discharge Education: Engorgement and breast care  Consult Status Consult Status: Complete Date: 09/26/21    [name] 09/26/2021, 9:33 AM

## 2021-09-27 ENCOUNTER — Ambulatory Visit: Payer: Self-pay | Admitting: Clinical

## 2021-09-27 ENCOUNTER — Encounter: Payer: Self-pay | Admitting: Family Medicine

## 2021-09-27 DIAGNOSIS — Z658 Other specified problems related to psychosocial circumstances: Secondary | ICD-10-CM

## 2021-09-28 NOTE — BH Specialist Note (Signed)
Pt did not arrive to video visit and did not answer the phone; Left HIPPA-compliant message to call back Lauralee Waters from [facility] at  [phone] (Gudrun Axe's office).  ?; left MyChart message for patient.  ? ?

## 2021-10-02 ENCOUNTER — Ambulatory Visit: Payer: Medicaid Other

## 2021-10-02 ENCOUNTER — Telehealth: Payer: Self-pay | Admitting: *Deleted

## 2021-10-02 NOTE — Telephone Encounter (Signed)
[facility] blood pressure appointment this am. I called her and notified her and asked if I can reschedule and how she is doing. She denies headaches, or edema. States she hasn't taken her blood pressure lately and is still in the hospital with her baby, baby not discharged yet. She does have transportation issues. She agreed to appointment 10/09/21. I advised her to call us if any issues with her blood pressure , etc. She states she is having some postpartum depression issues and is seeing Asher Muir for that  Lupita Rosales,RN

## 2021-10-03 ENCOUNTER — Ambulatory Visit: Payer: Self-pay

## 2021-10-03 NOTE — Lactation Note (Signed)
This note was copied from a baby's chart. Lactation Consultation Note LC paged to room of patient who was previously "completed" for lactation services. Infant will d/c today. Mother is pumping 3-4 x day. She is challenging at breast occasionally. Mother requests a pump for at-home use. I provided a hand pump for her use until her [facility] appointment scheduled later this week. I encouraged mother to increase pumping to q3 and/or increase frequency of bf'ing.   Patient Name: Ann Huerta [id number] Date: 10/03/2021   Age:26 days  Maternal Data  output 3-4 x day. Mother denies breast pain or difficulty using pump.  Feeding Nipple Type: Nfant Slow Flow (purple)   Lactation Tools Discussed/Used  Hand pump provided.  Interventions  education  Consult Status  complete  Ann Huerta 10/03/2021, 5:47 PM

## 2021-10-09 ENCOUNTER — Ambulatory Visit: Payer: Medicaid Other

## 2021-10-10 ENCOUNTER — Telehealth: Payer: Self-pay

## 2021-10-10 NOTE — Telephone Encounter (Signed)
Called pt to follow up on missed appt for BP check yesterday. VM left stating I am calling to reschedule appt. MyChart message sent.

## 2021-10-12 ENCOUNTER — Ambulatory Visit: Payer: Medicaid Other | Admitting: Clinical

## 2021-10-12 DIAGNOSIS — Z91199 Patient's noncompliance with other medical treatment and regimen due to unspecified reason: Secondary | ICD-10-CM

## 2021-10-27 ENCOUNTER — Ambulatory Visit: Payer: Self-pay | Admitting: Family Medicine

## 2021-11-15 ENCOUNTER — Encounter: Payer: Self-pay | Admitting: Family Medicine

## 2021-11-15 ENCOUNTER — Other Ambulatory Visit: Payer: Self-pay

## 2021-11-15 ENCOUNTER — Ambulatory Visit (INDEPENDENT_AMBULATORY_CARE_PROVIDER_SITE_OTHER): Payer: Medicaid Other | Admitting: Family Medicine

## 2021-11-15 ENCOUNTER — Other Ambulatory Visit (HOSPITAL_COMMUNITY)
Admission: RE | Admit: 2021-11-15 | Discharge: 2021-11-15 | Disposition: A | Discharge status: Home / Self Care | Payer: Medicaid Other | Source: Ambulatory Visit | Attending: Family Medicine | Admitting: Family Medicine

## 2021-11-15 DIAGNOSIS — Z113 Encounter for screening for infections with a predominantly sexual mode of transmission: Secondary | ICD-10-CM | POA: Diagnosis present

## 2021-11-15 DIAGNOSIS — Z30430 Encounter for insertion of intrauterine contraceptive device: Secondary | ICD-10-CM | POA: Diagnosis not present

## 2021-11-15 DIAGNOSIS — Z124 Encounter for screening for malignant neoplasm of cervix: Secondary | ICD-10-CM

## 2021-11-15 DIAGNOSIS — Z975 Presence of (intrauterine) contraceptive device: Secondary | ICD-10-CM | POA: Insufficient documentation

## 2021-11-15 DIAGNOSIS — A5901 Trichomonal vulvovaginitis: Secondary | ICD-10-CM

## 2021-11-15 LAB — POCT PREGNANCY, URINE: Preg Test, Ur: NEGATIVE

## 2021-11-15 MED ORDER — LEVONORGESTREL 20.1 MCG/DAY IU IUD
1.0000 | INTRAUTERINE_SYSTEM | Freq: Once | INTRAUTERINE | Status: AC
  Administered 2021-11-15: 1 via INTRAUTERINE

## 2021-11-15 NOTE — Progress Notes (Signed)
? ? ?  GYNECOLOGY OFFICE PROCEDURE NOTE ? ?Ann Huerta is a 26 y.o. [phone] here for Mirena IUD insertion. No GYN concerns.  Last pap smear: ? ?No results found for: DIAGPAP, HPV, HPVHIGH ? ?Urine pregnancy test: negative ? ?IUD Insertion Procedure Note ?Patient identified, informed consent performed, consent signed.   Discussed risks of irregular bleeding, increased cramping, infection, malpositioning or misplacement of the IUD outside the uterus which may require further procedure such as laparoscopy. Also discussed >99% contraception efficacy, increased risk of ectopic pregnancy with failure of method.  Time out was performed. ? ?Speculum placed in the vagina.  Cervix visualized.  Cleaned with Betadine x 2.  Grasped anteriorly with a single tooth tenaculum.  Uterus sounded to 8 cm. IUD placed per manufacturer's recommendations.  Strings trimmed to 3 cm. Tenaculum was removed, good hemostasis noted.  Patient tolerated procedure well.  ? ?Patient was given post-procedure instructions.  She was advised to have backup contraception for one week.  Patient was also asked to check IUD strings periodically and follow up in 4 weeks for IUD check. ? ?Venora Maples, MD/MPH ?Attending Family Medicine Physician, Faculty Practice ?Center for [employer], [facility] ? ?

## 2021-11-15 NOTE — Progress Notes (Signed)
? ? ?Post Partum Visit Note ? ?Ann Huerta is a 26 y.o. [phone] female who presents for a postpartum visit. She is 7 weeks postpartum following a normal spontaneous vaginal delivery.  I have fully reviewed the prenatal and intrapartum course. The delivery was at 39.0 gestational weeks.  Anesthesia: epidural. Postpartum course has been uneventful. Baby is doing well. Baby is feeding by bottle - Enfamil Lipil. Bleeding no bleeding. Bowel function is normal. Bladder function is normal. Patient is sexually active. Contraception method is IUD. Postpartum depression screening: negative. ? ? ?The pregnancy intention screening data noted above was reviewed. Potential methods of contraception were discussed. The patient elected to proceed with No data recorded. ? ? Edinburgh Postnatal Depression Scale - 11/15/21 1006   ? ?  ? Edinburgh Postnatal Depression Scale:  In the Past 7 Days  ? I have been able to laugh and see the funny side of things. 0   ? I have looked forward with enjoyment to things. 0   ? I have blamed myself unnecessarily when things went wrong. 1   ? I have been anxious or worried for no good reason. 0   ? I have felt scared or panicky for no good reason. 0   ? Things have been getting on top of me. 0   ? I have been so unhappy that I have had difficulty sleeping. 0   ? I have felt sad or miserable. 0   ? I have been so unhappy that I have been crying. 0   ? The thought of harming myself has occurred to me. 0   ? Edinburgh Postnatal Depression Scale Total 1   ? ?  ?  ? ?  ? ? ?Health Maintenance Due  ?Topic Date Due  ? PAP-Cervical Cytology Screening  Never done  ? PAP SMEAR-Modifier  Never done  ? ? ?The following portions of the patient's history were reviewed and updated as appropriate: allergies, current medications, past family history, past medical history, past social history, past surgical history, and problem list. ? ?Review of Systems ?Pertinent items noted in HPI and remainder of  comprehensive ROS otherwise negative. ? ?Objective:  ?BP 131/88   Pulse 73   Wt 142 lb 3.2 oz (64.5 kg)   LMP 11/10/2021 (Exact Date)   Breastfeeding No   BMI 25.19 kg/m?   ? ?General:  alert, cooperative, and appears stated age  ? Breasts:  not indicated  ?Lungs: Comfortable on room air  ?Wound N/a  ?GU exam:  normal  ?     ?Assessment:  ? ? There are no diagnoses linked to this encounter. ? ?Normal postpartum exam.  ? ?Plan:  ? ?Essential components of care per ACOG recommendations: ? ?1.  Mood and well being: Patient with negative depression screening today. Reviewed local resources for support.  ?- Patient tobacco use? Yes. Patient desires to quit? Yes.Discussed reduction and cessation  ?- hx of drug use? No.   ? ?2. Infant care and feeding:  ?-Patient currently breastmilk feeding? No.  ?-Social determinants of health (SDOH) reviewed in EPIC. Food insecurity: sent to [employer] after isit ? ?3. Sexuality, contraception and birth spacing ?- Patient does not want a pregnancy in the next year.  Desired family size is 2 children.  ?- Reviewed reproductive life planning. Reviewed contraceptive methods based on pt preferences and effectiveness.  Patient desired IUD or IUS today.  See procedure note.  ?- Discussed birth spacing of 18 months ? ?4.  Sleep and fatigue ?-Encouraged family/partner/community support of 4 hrs of uninterrupted sleep to help with mood and fatigue ? ?5. Physical Recovery  ?- Discussed patients delivery and complications. She describes her labor as good. ?- Patient had a Vaginal, no problems at delivery. Patient had  no  laceration. Perineal healing reviewed. Patient expressed understanding ?- Patient has urinary incontinence? No. ?- Patient is safe to resume physical and sexual activity ? ?6.  Health Maintenance ?- HM due items addressed Yes ?- Last pap smear No results found for: DIAGPAP Pap smear done at today's visit.  ?-Breast Cancer screening indicated? No.  ? ?7. Chronic  Disease/Pregnancy Condition follow up: Hypertension ?-Gestational HTN: not taking Nifedipine at present, BP well controlled today ?- PCP follow up ? ?Venora Maples, MD ?Center for [facility], [facility] ? ?

## 2021-11-20 LAB — CYTOLOGY - PAP
Chlamydia: NEGATIVE
Comment: NEGATIVE
Comment: NEGATIVE
Comment: NEGATIVE
Comment: NORMAL
Diagnosis: NEGATIVE
Diagnosis: REACTIVE
High risk HPV: NEGATIVE
Neisseria Gonorrhea: NEGATIVE
Trichomonas: POSITIVE — AB

## 2021-11-27 ENCOUNTER — Other Ambulatory Visit: Payer: Self-pay | Admitting: Family Medicine

## 2021-11-27 ENCOUNTER — Other Ambulatory Visit (HOSPITAL_COMMUNITY): Payer: Self-pay

## 2021-11-27 DIAGNOSIS — A5901 Trichomonal vulvovaginitis: Secondary | ICD-10-CM | POA: Insufficient documentation

## 2021-11-27 MED ORDER — METRONIDAZOLE 500 MG PO TABS
500.0000 mg | ORAL_TABLET | Freq: Two times a day (BID) | ORAL | 0 refills | Status: DC
  Filled 2021-11-27: qty 14, 7d supply, fill #0

## 2021-11-27 MED ORDER — METRONIDAZOLE 500 MG PO TABS: 500.0000 mg | ORAL_TABLET | Freq: Two times a day (BID) | ORAL | 0 refills | Status: AC

## 2021-11-27 NOTE — Addendum Note (Signed)
Addended by: Merian Capron on: 11/27/2021 12:15 PM ? ? Modules accepted: Orders ? ?

## 2021-11-27 NOTE — Progress Notes (Signed)
Resending rx to [place] ?

## 2021-12-08 ENCOUNTER — Emergency Department (HOSPITAL_COMMUNITY): Payer: Medicaid Other

## 2021-12-08 ENCOUNTER — Emergency Department (HOSPITAL_COMMUNITY)
Admission: EM | Admit: 2021-12-08 | Discharge: 2021-12-08 | Disposition: A | Discharge status: Home / Self Care | Payer: Medicaid Other | Attending: Emergency Medicine | Admitting: Emergency Medicine

## 2021-12-08 ENCOUNTER — Other Ambulatory Visit: Payer: Self-pay

## 2021-12-08 ENCOUNTER — Encounter (HOSPITAL_COMMUNITY): Payer: Self-pay

## 2021-12-08 DIAGNOSIS — W1839XA Other fall on same level, initial encounter: Secondary | ICD-10-CM | POA: Insufficient documentation

## 2021-12-08 DIAGNOSIS — S83004A Unspecified dislocation of right patella, initial encounter: Secondary | ICD-10-CM | POA: Insufficient documentation

## 2021-12-08 DIAGNOSIS — Y92009 Unspecified place in unspecified non-institutional (private) residence as the place of occurrence of the external cause: Secondary | ICD-10-CM | POA: Insufficient documentation

## 2021-12-08 DIAGNOSIS — S8991XA Unspecified injury of right lower leg, initial encounter: Secondary | ICD-10-CM | POA: Diagnosis present

## 2021-12-08 MED ORDER — HYDROMORPHONE HCL 1 MG/ML IJ SOLN
1.0000 mg | Freq: Once | INTRAMUSCULAR | Status: AC
  Administered 2021-12-08: 1 mg via INTRAVENOUS
  Filled 2021-12-08: qty 1

## 2021-12-08 NOTE — Progress Notes (Signed)
Orthopedic Tech Progress Note ?Patient Details:  ?Ann Huerta ?1996/07/30 ?[id number] ? ?Ortho Devices ?Type of Ortho Device: Crutches, Knee Immobilizer ?Ortho Device/Splint Location: RLE ?Ortho Device/Splint Interventions: Ordered, Application, Adjustment ?  ?Post Interventions ?Patient Tolerated: Well, Ambulated well ?Instructions Provided: Poper ambulation with device, Care of device ? ?Donald Pore ?12/08/2021, 9:09 AM ? ?

## 2021-12-08 NOTE — ED Triage Notes (Signed)
Pt bib ems from home c/o right knee patellar dislocation. Pt was laying baby down to rest and knee just slid out of place.  ? ?Pt received 100 fentanyl  ? ?BP 134/94 ?HR 84 ?RR 18 ?RA 97%  ?

## 2021-12-08 NOTE — Discharge Instructions (Addendum)
Wear knee immobilizer and follow-up with orthopedics.  It is okay to take knee immobilizer off to take shower but be careful not to flex or extend at the knee.  Recommend ice, Tylenol, ibuprofen.  Use crutches for comfort. ?

## 2021-12-08 NOTE — ED Notes (Signed)
Ortho tech en route to place immobilizer and educate pt on crutches.  ?

## 2021-12-08 NOTE — ED Provider Notes (Signed)
?[facility] EMERGENCY DEPARTMENT ?Provider Note ? ? ?CSN: [id number] ?Arrival date & time: 12/08/21  0806 ? ?  ? ?History ? ?Chief Complaint  ?Patient presents with  ? Knee Injury  ? ? ?Ann Huerta is a 26 y.o. female. ? ?Fell on right knee, felt bone shift, per EMS believe patella has dislocated. No hx of same but states knee has given out in the past but not like this. No weakness or numbness ? ? ?The history is provided by the patient.  ?Knee Pain ?Location:  Knee ?Knee location:  R knee ?Pain details:  ?  Quality:  Aching ?  Radiates to:  Does not radiate ?  Severity:  Moderate ?  Onset quality:  Sudden ?  Timing:  Constant ?  Progression:  Unchanged ?Chronicity:  New ?Relieved by:  Nothing ?Worsened by:  Nothing ?Associated symptoms: decreased ROM   ?Associated symptoms: no back pain, no fatigue, no fever, no itching, no muscle weakness, no neck pain, no numbness, no stiffness, no swelling and no tingling   ? ?  ? ?Home Medications ?Prior to Admission medications   ?Medication Sig Start Date End Date Taking? Authorizing Provider  ?ibuprofen (ADVIL) 600 MG tablet Take 1 tablet (600 mg total) by mouth every 6 (six) hours. 09/25/21   Warner Mccreedy, MD  ?NIFEdipine (ADALAT CC) 30 MG 24 hr tablet Take 1 tablet (30 mg total) by mouth daily. ?Patient not taking: Reported on 11/15/2021 09/25/21 11/24/21  Warner Mccreedy, MD  ?   ? ?Allergies    ?Patient has no known allergies.   ? ?Review of Systems   ?Review of Systems  ?Constitutional:  Negative for fatigue and fever.  ?Musculoskeletal:  Negative for back pain, neck pain and stiffness.  ?Skin:  Negative for itching.  ? ?Physical Exam ?Updated Vital Signs ?BP 124/81   Pulse 71   Temp 97.9 ?F (36.6 ?C) (Oral)   Resp 13   Ht 5\' 3"  (1.6 m)   Wt 74.8 kg   LMP 11/19/2021 (Exact Date)   SpO2 100%   BMI 29.23 kg/m?  ?Physical Exam ?Vitals and nursing note reviewed.  ?Constitutional:   ?   General: She is not in acute distress. ?   Appearance: She is  well-developed. She is not ill-appearing.  ?HENT:  ?   Head: Normocephalic and atraumatic.  ?Eyes:  ?   Conjunctiva/sclera: Conjunctivae normal.  ?Cardiovascular:  ?   Rate and Rhythm: Normal rate and regular rhythm.  ?   Pulses: Normal pulses.  ?   Heart sounds: Normal heart sounds. No murmur heard. ?Pulmonary:  ?   Effort: Pulmonary effort is normal. No respiratory distress.  ?   Breath sounds: Normal breath sounds.  ?Abdominal:  ?   Palpations: Abdomen is soft.  ?   Tenderness: There is no abdominal tenderness.  ?Musculoskeletal:     ?   General: Tenderness (TTP to right patella that appears to be dislocated laterally to the right) present. No swelling.  ?   Cervical back: Neck supple.  ?Skin: ?   General: Skin is warm and dry.  ?   Capillary Refill: Capillary refill takes less than 2 seconds.  ?Neurological:  ?   Mental Status: She is alert.  ?   Sensory: No sensory deficit.  ?   Motor: No weakness.  ?Psychiatric:     ?   Mood and Affect: Mood normal.  ? ?ED Results / Procedures / Treatments   ?Labs ?(all labs  ordered are listed, but only abnormal results are displayed) ?Labs Reviewed - No data to display ? ?EKG ?None ? ?Radiology ?DG Knee 1-2 Views Right ? ?Result Date: 12/08/2021 ?CLINICAL DATA:  Post reduction patella EXAM: RIGHT KNEE - 1-2 VIEW COMPARISON:  05/11/2021 FINDINGS: Alignment appears anatomic. There is no acute fracture. Joint effusion is present. Ventral soft tissue swelling. IMPRESSION: No fracture or malalignment.  Joint effusion. Electronically Signed   By: Guadlupe Spanish M.D.   On: 12/08/2021 08:55   ? ?Procedures ?Marland KitchenOrtho Injury Treatment ? ?Date/Time: 12/08/2021 8:35 AM ?Performed by: Virgina Norfolk, DO ?Authorized by: Virgina Norfolk, DO  ? ?Consent:  ?  Consent obtained:  Verbal ?  Consent given by:  Patient ?  Risks discussed:  Fracture, irreducible dislocation, nerve damage, recurrent dislocation, restricted joint movement, stiffness and vascular damage ?  Alternatives discussed:  No  treatmentInjury location: knee ?Injury type: dislocation (right patellar dislocation) ?Dislocation type: lateral patellar ?Pre-procedure neurovascular assessment: neurovascularly intact ?Pre-procedure distal perfusion: normal ?Pre-procedure neurological function: normal ?Pre-procedure range of motion: reduced ? ?Anesthesia: ?Local anesthesia used: no ? ?Patient sedated: NoManipulation performed: yes ?Reduction method: direct traction ?Reduction successful: yes ?X-ray confirmed reduction: yes ?Immobilization: crutches (knee immobilzer) ?Splint Applied by: ED Provider ?Post-procedure neurovascular assessment: post-procedure neurovascularly intact ?Post-procedure distal perfusion: normal ?Post-procedure neurological function: normal ?Post-procedure range of motion: normal ? ?  ? ? ?Medications Ordered in ED ?Medications  ?HYDROmorphone (DILAUDID) injection 1 mg (1 mg Intravenous Given 12/08/21 0817)  ? ? ?ED Course/ Medical Decision Making/ A&P ?  ?                        ?Medical Decision Making ?Amount and/or Complexity of Data Reviewed ?Radiology: ordered. ? ?Risk ?Prescription drug management. ? ? ?Ann Huerta is here with dislocation of her right patellar.  Patient states that just prior to coming here she went to put down her child for nap and felt her knee buckle and landed on her right knee.  Appears that she has dislocated her right patellar.  Has not done this in the past.  Neurovascular neuromuscular intact.  States that she has her legs give out on her own times but has not had this particular process happen before.  Patient has already been given IV fentanyl with EMS and due to her discomfort I gave her an additional dose IV Dilaudid and after talking with her she was okay with me to try and relocate patellar immediately.  This was successful.  X-ray postreduction was without evidence of ongoing fracture or dislocation.  Patient was placed in a knee immobilizer.  Given crutches.  Recommend ice,  Tylenol, ibuprofen.  Weightbearing as tolerated in knee immobilizer we will have her follow-up with orthopedics.  Neurovascularly intact before and after injury. ? ?This chart was dictated using voice recognition software.  Despite best efforts to proofread,  errors can occur which can change the documentation meaning.  ? ? ? ? ? ? ? ?Final Clinical Impression(s) / ED Diagnoses ?Final diagnoses:  ?Dislocation of right patella, initial encounter  ? ? ?Rx / DC Orders ?ED Discharge Orders   ? ? None  ? ?  ? ? ?  ?Virgina Norfolk, DO ?12/08/21 0900 ? ?

## 2021-12-19 ENCOUNTER — Ambulatory Visit: Payer: Medicaid Other | Admitting: Family Medicine

## 2022-03-03 ENCOUNTER — Emergency Department (HOSPITAL_COMMUNITY)
Admission: EM | Admit: 2022-03-03 | Discharge: 2022-03-04 | Discharge status: Against Medical Advice | Payer: 59 | Attending: Emergency Medicine | Admitting: Emergency Medicine

## 2022-03-03 ENCOUNTER — Encounter (HOSPITAL_COMMUNITY): Payer: Self-pay

## 2022-03-03 DIAGNOSIS — Z5321 Procedure and treatment not carried out due to patient leaving prior to being seen by health care provider: Secondary | ICD-10-CM | POA: Insufficient documentation

## 2022-03-03 DIAGNOSIS — R1031 Right lower quadrant pain: Secondary | ICD-10-CM | POA: Insufficient documentation

## 2022-03-03 LAB — COMPREHENSIVE METABOLIC PANEL
ALT: 7 U/L (ref 0–44)
AST: 9 U/L — ABNORMAL LOW (ref 15–41)
Albumin: 3.3 g/dL — ABNORMAL LOW (ref 3.5–5.0)
Alkaline Phosphatase: 57 U/L (ref 38–126)
Anion gap: 12 (ref 5–15)
BUN: 9 mg/dL (ref 6–20)
CO2: 26 mmol/L (ref 22–32)
Calcium: 9 mg/dL (ref 8.9–10.3)
Chloride: 101 mmol/L (ref 98–111)
Creatinine, Ser: 0.84 mg/dL (ref 0.44–1.00)
GFR, Estimated: >60 mL/min (ref >60)
Glucose, Bld: 106 mg/dL — ABNORMAL HIGH (ref 70–99)
Potassium: 3.8 mmol/L (ref 3.5–5.1)
Sodium: 139 mmol/L (ref 135–145)
Total Bilirubin: 0.5 mg/dL (ref 0.3–1.2)
Total Protein: 6.8 g/dL (ref 6.5–8.1)

## 2022-03-03 LAB — URINALYSIS, ROUTINE W REFLEX MICROSCOPIC
Bilirubin Urine: NEGATIVE
Glucose, UA: NEGATIVE mg/dL
Ketones, ur: NEGATIVE mg/dL
Nitrite: POSITIVE — AB
Protein, ur: 30 mg/dL — AB
Specific Gravity, Urine: 1.025 (ref 1.005–1.030)
pH: 5 (ref 5.0–8.0)

## 2022-03-03 LAB — URINALYSIS, MICROSCOPIC (REFLEX): WBC, UA: >50 WBC/hpf (ref 0–5)

## 2022-03-03 LAB — CBC
HCT: 32.7 % — ABNORMAL LOW (ref 36.0–46.0)
Hemoglobin: 11.1 g/dL — ABNORMAL LOW (ref 12.0–15.0)
MCH: 31.2 pg (ref 26.0–34.0)
MCHC: 33.9 g/dL (ref 30.0–36.0)
MCV: 91.9 fL (ref 80.0–100.0)
Platelets: 434 10*3/uL — ABNORMAL HIGH (ref 150–400)
RBC: 3.56 MIL/uL — ABNORMAL LOW (ref 3.87–5.11)
RDW: 14.4 % (ref 11.5–15.5)
WBC: 9.7 10*3/uL (ref 4.0–10.5)
nRBC: 0 % (ref 0.0–0.2)

## 2022-03-03 LAB — I-STAT BETA HCG BLOOD, ED (MC, WL, AP ONLY): I-stat hCG, quantitative: <5 m[IU]/mL (ref <5)

## 2022-03-03 LAB — LIPASE, BLOOD: Lipase: 22 U/L (ref 11–51)

## 2022-03-03 NOTE — ED Triage Notes (Signed)
Pt states that for the past few days she has been having RLQ abd pain, denies n/v/d, no fevers, denies urinary symptoms.

## 2022-03-04 NOTE — ED Notes (Signed)
PT called for vitals X3 no response. Will attempt again shortly 

## 2022-03-04 NOTE — ED Notes (Signed)
PT called multiple times for vitals no response

## 2022-03-08 ENCOUNTER — Emergency Department (HOSPITAL_COMMUNITY): Payer: 59

## 2022-03-08 ENCOUNTER — Encounter (HOSPITAL_COMMUNITY): Payer: Self-pay | Admitting: Emergency Medicine

## 2022-03-08 ENCOUNTER — Emergency Department (HOSPITAL_COMMUNITY)
Admission: EM | Admit: 2022-03-08 | Discharge: 2022-03-08 | Disposition: A | Discharge status: Home / Self Care | Payer: 59 | Attending: Student | Admitting: Student

## 2022-03-08 DIAGNOSIS — N390 Urinary tract infection, site not specified: Secondary | ICD-10-CM | POA: Insufficient documentation

## 2022-03-08 DIAGNOSIS — K529 Noninfective gastroenteritis and colitis, unspecified: Secondary | ICD-10-CM | POA: Insufficient documentation

## 2022-03-08 DIAGNOSIS — N9489 Other specified conditions associated with female genital organs and menstrual cycle: Secondary | ICD-10-CM | POA: Insufficient documentation

## 2022-03-08 DIAGNOSIS — R1084 Generalized abdominal pain: Secondary | ICD-10-CM | POA: Diagnosis present

## 2022-03-08 DIAGNOSIS — B9689 Other specified bacterial agents as the cause of diseases classified elsewhere: Secondary | ICD-10-CM

## 2022-03-08 LAB — COMPREHENSIVE METABOLIC PANEL
ALT: 9 U/L (ref 0–44)
AST: 8 U/L — ABNORMAL LOW (ref 15–41)
Albumin: 4 g/dL (ref 3.5–5.0)
Alkaline Phosphatase: 59 U/L (ref 38–126)
Anion gap: 13 (ref 5–15)
BUN: 17 mg/dL (ref 6–20)
CO2: 26 mmol/L (ref 22–32)
Calcium: 9.7 mg/dL (ref 8.9–10.3)
Chloride: 102 mmol/L (ref 98–111)
Creatinine, Ser: 0.88 mg/dL (ref 0.44–1.00)
GFR, Estimated: >60 mL/min (ref >60)
Glucose, Bld: 128 mg/dL — ABNORMAL HIGH (ref 70–99)
Potassium: 4.4 mmol/L (ref 3.5–5.1)
Sodium: 141 mmol/L (ref 135–145)
Total Bilirubin: 0.6 mg/dL (ref 0.3–1.2)
Total Protein: 8.5 g/dL — ABNORMAL HIGH (ref 6.5–8.1)

## 2022-03-08 LAB — LIPASE, BLOOD: Lipase: 20 U/L (ref 11–51)

## 2022-03-08 LAB — URINALYSIS, ROUTINE W REFLEX MICROSCOPIC
Bilirubin Urine: NEGATIVE
Glucose, UA: NEGATIVE mg/dL
Ketones, ur: 20 mg/dL — AB
Nitrite: POSITIVE — AB
Protein, ur: 100 mg/dL — AB
Specific Gravity, Urine: 1.031 — ABNORMAL HIGH (ref 1.005–1.030)
WBC, UA: >50 WBC/hpf — ABNORMAL HIGH (ref 0–5)
pH: 5 (ref 5.0–8.0)

## 2022-03-08 LAB — I-STAT BETA HCG BLOOD, ED (MC, WL, AP ONLY): I-stat hCG, quantitative: <5 m[IU]/mL (ref <5)

## 2022-03-08 LAB — CBC WITH DIFFERENTIAL/PLATELET
Abs Immature Granulocytes: 0.03 10*3/uL (ref 0.00–0.07)
Basophils Absolute: 0.1 10*3/uL (ref 0.0–0.1)
Basophils Relative: 1 %
Eosinophils Absolute: 0 10*3/uL (ref 0.0–0.5)
Eosinophils Relative: 0 %
HCT: 40.2 % (ref 36.0–46.0)
Hemoglobin: 13.3 g/dL (ref 12.0–15.0)
Immature Granulocytes: 0 %
Lymphocytes Relative: 9 %
Lymphs Abs: 1 10*3/uL (ref 0.7–4.0)
MCH: 30.6 pg (ref 26.0–34.0)
MCHC: 33.1 g/dL (ref 30.0–36.0)
MCV: 92.6 fL (ref 80.0–100.0)
Monocytes Absolute: 0.7 10*3/uL (ref 0.1–1.0)
Monocytes Relative: 6 %
Neutro Abs: 9.2 10*3/uL — ABNORMAL HIGH (ref 1.7–7.7)
Neutrophils Relative %: 84 %
Platelets: 556 10*3/uL — ABNORMAL HIGH (ref 150–400)
RBC: 4.34 MIL/uL (ref 3.87–5.11)
RDW: 14.1 % (ref 11.5–15.5)
WBC: 11 10*3/uL — ABNORMAL HIGH (ref 4.0–10.5)
nRBC: 0 % (ref 0.0–0.2)

## 2022-03-08 LAB — WET PREP, GENITAL
Sperm: NONE SEEN
Trich, Wet Prep: NONE SEEN
WBC, Wet Prep HPF POC: <10 (ref <10)
Yeast Wet Prep HPF POC: NONE SEEN

## 2022-03-08 MED ORDER — IOHEXOL 300 MG/ML  SOLN
100.0000 mL | Freq: Once | INTRAMUSCULAR | Status: AC | PRN
  Administered 2022-03-08: 100 mL via INTRAVENOUS

## 2022-03-08 MED ORDER — ONDANSETRON 8 MG PO TBDP
8.0000 mg | ORAL_TABLET | Freq: Once | ORAL | Status: AC
  Administered 2022-03-08: 8 mg via ORAL
  Filled 2022-03-08: qty 1

## 2022-03-08 MED ORDER — HYDROCODONE-ACETAMINOPHEN 5-325 MG PO TABS
1.0000 | ORAL_TABLET | Freq: Once | ORAL | Status: AC
  Administered 2022-03-08: 1 via ORAL
  Filled 2022-03-08: qty 1

## 2022-03-08 MED ORDER — ONDANSETRON HCL 4 MG PO TABS
4.0000 mg | ORAL_TABLET | Freq: Four times a day (QID) | ORAL | 0 refills | Status: AC
  Filled 2022-03-08 – 2022-03-09 (×2): qty 12, 3d supply, fill #0

## 2022-03-08 MED ORDER — CEFDINIR 300 MG PO CAPS
300.0000 mg | ORAL_CAPSULE | Freq: Two times a day (BID) | ORAL | 0 refills | Status: AC
  Filled 2022-03-08 – 2022-03-09 (×2): qty 14, 7d supply, fill #0

## 2022-03-08 MED ORDER — ONDANSETRON 4 MG PO TBDP
4.0000 mg | ORAL_TABLET | Freq: Once | ORAL | Status: DC
Start: 2022-03-08 — End: 2022-03-08

## 2022-03-08 MED ORDER — SODIUM CHLORIDE (PF) 0.9 % IJ SOLN
INTRAMUSCULAR | Status: AC
Start: 2022-03-08 — End: 2022-03-08
  Filled 2022-03-08: qty 50

## 2022-03-08 MED ORDER — METRONIDAZOLE 500 MG PO TABS
500.0000 mg | ORAL_TABLET | Freq: Two times a day (BID) | ORAL | 0 refills | Status: AC
  Filled 2022-03-08 – 2022-03-09 (×2): qty 14, 7d supply, fill #0

## 2022-03-08 NOTE — ED Provider Triage Note (Signed)
Emergency Medicine Provider Triage Evaluation Note  Fayth Trefry , a 26 y.o. female  was evaluated in triage.  Pt complains of generalized abdominal pain, nausea, vomiting, and left-sided back pain.  Patient states that her symptoms began approximately 1 week ago and have been worsening since that time.  She denies constipation or diarrhea, denies shortness of breath, denies chest pain.  Patient is unsure if she may be pregnant.  Review of Systems  Positive: Abdominal pain, flank pain, nausea, vomiting Negative: Chest pain, shortness of breath  Physical Exam  BP (!) 136/100 (BP Location: Left Arm)   Pulse 96   Temp 97.8 F (36.6 C) (Oral)   Resp 16   SpO2 99%  Gen:   Awake, no distress   Resp:  Normal effort  MSK:   Moves extremities without difficulty  Other:  Diffusely tender abdomen  Medical Decision Making  Medically screening exam initiated at 11:31 AM.  Appropriate orders placed.  Haydin Dunn was informed that the remainder of the evaluation will be completed by another provider, this initial triage assessment does not replace that evaluation, and the importance of remaining in the ED until their evaluation is complete.     Darrick Grinder, PA-C 03/08/22 1133

## 2022-03-08 NOTE — ED Triage Notes (Signed)
Patient here from home reporting lower abd pain radiating into back that started 3 days ago. Denies urinary symptoms.

## 2022-03-08 NOTE — ED Provider Notes (Signed)
[facility]-EMERGENCY DEPT Provider Note   CSN: [id number] Arrival date & time: 03/08/22  1052     History  Chief Complaint  Patient presents with   Abdominal Pain   Back Pain    Ann Huerta is a 26 y.o. female.  Patient presents to the hospital complaining of generalized abdominal pain, nausea, vomiting, and left-sided back pain.  Patient states that she began to have abdominal pain with some left-sided back pain 1 week ago.  Her pain has worsened since that time.  She denies any constipation or diarrhea, shortness of breath, chest pain.  She does endorse generalized abdominal pain, left-sided flank pain, nausea, vomiting.  Patient states that based on her symptoms she is concerned that she may have a kidney stone.  Past medical history significant for history of UTI, anxiety  HPI     Home Medications Prior to Admission medications   Medication Sig Start Date End Date Taking? Authorizing Provider  ibuprofen (ADVIL) 600 MG tablet Take 1 tablet (600 mg total) by mouth every 6 (six) hours. 09/25/21   Warner Mccreedy, MD  NIFEdipine (ADALAT CC) 30 MG 24 hr tablet Take 1 tablet (30 mg total) by mouth daily. Patient not taking: Reported on 11/15/2021 09/25/21 11/24/21  Warner Mccreedy, MD      Allergies    Patient has no known allergies.    Review of Systems   Review of Systems  Constitutional:  Negative for fever.  Respiratory:  Negative for shortness of breath.   Cardiovascular:  Negative for chest pain.  Gastrointestinal:  Positive for abdominal pain, nausea and vomiting. Negative for constipation and diarrhea.  Genitourinary:  Positive for flank pain. Negative for pelvic pain and vaginal discharge.    Physical Exam Updated Vital Signs BP 107/75 (BP Location: Left Arm)   Pulse 89   Temp 98.2 F (36.8 C) (Oral)   Resp 16   Ht 5\' 3"  (1.6 m)   Wt 74.8 kg   SpO2 97%   BMI 29.23 kg/m  Physical Exam Vitals and nursing note reviewed.  Constitutional:       General: She is not in acute distress.    Appearance: She is normal weight.  HENT:     Head: Normocephalic and atraumatic.     Mouth/Throat:     Mouth: Mucous membranes are moist.  Eyes:     General: No scleral icterus.    Extraocular Movements: Extraocular movements intact.  Cardiovascular:     Rate and Rhythm: Normal rate and regular rhythm.     Heart sounds: Normal heart sounds.  Pulmonary:     Effort: Pulmonary effort is normal.     Breath sounds: Normal breath sounds.  Abdominal:     General: Abdomen is flat. There is no distension.     Palpations: Abdomen is soft.     Tenderness: There is no right CVA tenderness or left CVA tenderness.  Skin:    General: Skin is warm and dry.     Capillary Refill: Capillary refill takes less than 2 seconds.  Neurological:     Mental Status: She is alert and oriented to person, place, and time.     ED Results / Procedures / Treatments   Labs (all labs ordered are listed, but only abnormal results are displayed) Labs Reviewed  CBC WITH DIFFERENTIAL/PLATELET - Abnormal; Notable for the following components:      Result Value   WBC 11.0 (*)    Platelets 556 (*)  Neutro Abs 9.2 (*)    All other components within normal limits  COMPREHENSIVE METABOLIC PANEL - Abnormal; Notable for the following components:   Glucose, Bld 128 (*)    Total Protein 8.5 (*)    AST 8 (*)    All other components within normal limits  URINALYSIS, ROUTINE W REFLEX MICROSCOPIC - Abnormal; Notable for the following components:   Color, Urine AMBER (*)    APPearance HAZY (*)    Specific Gravity, Urine 1.031 (*)    Hgb urine dipstick MODERATE (*)    Ketones, ur 20 (*)    Protein, ur 100 (*)    Nitrite POSITIVE (*)    Leukocytes,Ua LARGE (*)    WBC, UA >50 (*)    Bacteria, UA MANY (*)    All other components within normal limits  URINE CULTURE  LIPASE, BLOOD  I-STAT BETA HCG BLOOD, ED ([facility], [facility], [facility] ONLY)    EKG None  Radiology CT Abdomen Pelvis W  Contrast  Result Date: 03/08/2022 CLINICAL DATA:  Abdominal pain, acute nonlocalized. EXAM: CT ABDOMEN AND PELVIS WITH CONTRAST TECHNIQUE: Multidetector CT imaging of the abdomen and pelvis was performed using the standard protocol following bolus administration of intravenous contrast. RADIATION DOSE REDUCTION: This exam was performed according to the departmental dose-optimization program which includes automated exposure control, adjustment of the mA and/or kV according to patient size and/or use of iterative reconstruction technique. CONTRAST:  OMNIPAQUE IOHEXOL 300 MG/ML  SOLN COMPARISON:  None Available. FINDINGS: Lower chest: No acute abnormality. Hepatobiliary: No focal liver abnormality is seen. No gallstones, gallbladder wall thickening, or biliary dilatation. Pancreas: Unremarkable. No pancreatic ductal dilatation or surrounding inflammatory changes. Spleen: Normal in size without focal abnormality. Adrenals/Urinary Tract: Adrenal glands are unremarkable. Kidneys are normal, without renal calculi, focal lesion, or hydronephrosis. Bladder is unremarkable. Mild external rotation of the left kidney. Stomach/Bowel: Stomach and small bowel loops are normal. Normal appendix. There is marked thickening of the distal transverse and descending colonic wall consistent with severe colitis. Mild adjacent fat stranding. No extraluminal free air. Vascular/Lymphatic: No significant vascular findings are present. No enlarged abdominal or pelvic lymph nodes. Reproductive: Uterus is normal in size. Intrauterine contraceptive device in satisfactory position. Complex cyst in the left adnexal region measuring at least 2.2 x 2.4 cm. Moderate amount of fluid in the dependent pelvis, which may be sequela of ruptured ovarian follicle. Other: No abdominal wall hernia or abnormality. Musculoskeletal: No acute or significant osseous findings. IMPRESSION: 1. Moderate thickening of the descending/sigmoid colonic wall  consistent with severe colitis. This is likely secondary to infectious/inflammatory etiology. No extraluminal air. 2.  Normal appendix. 3. Complex loculated cyst in the left adnexal region. Moderate amount of fluid in the dependent pelvis, which may be secondary to ruptured ovarian follicle. Pelvic sonogram would be helpful for further evaluation. Above findings and recommendations were reported to PA Genifer Lazenby at approximately 2:40 p.m. on 03/08/2022. Electronically Signed   By: Larose Hires D.O.   On: 03/08/2022 14:41    Procedures Procedures    Medications Ordered in ED Medications  ondansetron (ZOFRAN-ODT) disintegrating tablet 4 mg (4 mg Oral Not Given 03/08/22 1322)  iohexol (OMNIPAQUE) 300 MG/ML solution 100 mL (100 mLs Intravenous Contrast Given 03/08/22 1408)  sodium chloride (PF) 0.9 % injection (  Given by Other 03/08/22 1436)    ED Course/ Medical Decision Making/ A&P Clinical Course as of 03/08/22 1542  Thu Mar 08, 2022  1537 Colitis, UTI, ovarian cyst. F/u w/ obgyn  outpatient w/ abx  [CR]    Clinical Course User Index [CR] Peter Garter, PA                           Medical Decision Making Amount and/or Complexity of Data Reviewed Labs: ordered. Radiology: ordered.  Risk Prescription drug management.   This patient presents to the ED for concern of generalized abdominal pain, this involves an extensive number of treatment options, and is a complaint that carries with it a high risk of complications and morbidity.  The differential diagnosis includes appendicitis, pancreatitis, cholecystitis, cystitis, gastroenteritis, and others   Co morbidities that complicate the patient evaluation  History of UTIs, anxiety   Lab Tests:  I Ordered, and personally interpreted labs.  The pertinent results include: Urinalysis suggestive of infection, negative pregnancy test, lipase 20, grossly normal CMP, WBC 11.0   Imaging Studies ordered:  I ordered imaging studies  including CT abdomen pelvis with contrast I independently visualized and interpreted imaging which showed  1. Moderate thickening of the descending/sigmoid colonic wall consistent with severe colitis. This is likely secondary to infectious/inflammatory etiology. No extraluminal air.  2. Normal appendix.  3. Complex loculated cyst in the left adnexal region. Moderate amount of fluid in the dependent pelvis, which may be secondary to ruptured ovarian follicle. Pelvic sonogram would be helpful for further evaluation.  I ordered a pelvic and transvaginal ultrasound to evaluate for possible ovarian cyst rupture.  Findings include I agree with the radiologist interpretation   Consultations Obtained:  Radiology contacted me with results from the CT scan   Problem List / ED Course / Critical interventions / Medication management   I ordered medication including Zofran for nausea, but the patient decided that her nausea was not submitted for medication at this time I have reviewed the patients home medicines and have made adjustments as needed   Test / Admission - Considered:  Patient care being transferred to Sherian Maroon PA-C at shift handoff. Anticipate discharge home pending ultrasound results. Patient has colitis and a urinary tract infection. I'm sending Keflex for the UTI. Patient will be given education on colitis. Plan for probable OB/GYN follow up depending on pelvic ultrasound.  No sign of appendicitis on CT. lipase not suggestive of pancreatitis.  Tenderness is diffuse and not suggestive of cholecystitis.  CT indicative of colitis.  UTI found incidentally.        Final Clinical Impression(s) / ED Diagnoses Final diagnoses:  Colitis  Lower urinary tract infectious disease    Rx / DC Orders ED Discharge Orders     None         Pamala Duffel 03/08/22 1542    Kommor, Wyn Forster, MD 03/09/22 1047

## 2022-03-08 NOTE — Discharge Instructions (Addendum)
Your work-up today was overall positive for colitis, urinary tract infection and left ovarian cyst.  Concerning colitis, we suggest alterations in your diet to a liquid diet to give your bowels of time to rest and recover.  We think this inflammatory process at this point given your reassuring laboratory studies.  Urinalysis was significant for urinary tract infection.  We are giving antibiotics to treat this.  You take 1 pill twice a day for the next 7 days.  One of the swabs we performed was positive for bacterial vaginosis; who prescribed another antibiotic called metronidazole to take twice a day for the next 7 days.  I will also prescribe Zofran; it is the antinausea/antivomiting medicine you were given while in the emergency department.  You can take this as needed for said symptoms.  It is for you to follow-up with your OB/GYN in the next 2 to 4 days for reevaluation of your ovarian cyst.  They will be the providers who will manage this long-term to make sure it resolves.  Follow MyChart for your GC/chlamydia results as well as your wet prep.  Contact your primary care provider or go to an urgent care for further antibiotic therapy if your test comes back positive.  Please not hesitate to return to the emergency department if the worrisome signs and symptoms we discussed become apparent.

## 2022-03-08 NOTE — ED Provider Notes (Signed)
  Physical Exam  BP 107/75 (BP Location: Left Arm)   Pulse 89   Temp 98.2 F (36.8 C) (Oral)   Resp 16   Ht 5\' 3"  (1.6 m)   Wt 74.8 kg   SpO2 97%   BMI 29.23 kg/m   Physical Exam  Procedures  Procedures  ED Course / MDM   Clinical Course as of 03/08/22 1544  Thu Mar 08, 2022  1537 Colitis, UTI, ovarian cyst. F/u w/ obgyn outpatient w/ abx  [CR]    Clinical Course User Index [CR] Mar 10, 2022, PA   Medical Decision Making Amount and/or Complexity of Data Reviewed Labs: ordered. Radiology: ordered.  Risk Prescription drug management.   Patient care handed off from Peter Garter, PA-C at shift change.  See prior note for full HPI/PE.  Treatment plan upon shift change is to reassess patient after pelvic ultrasound performed to rule out torsion.  She presented to the emergency department with generalized abdominal pain, nausea, vomiting for approximately 1 week.  Symptoms has worsened since their onset.  CT imaging performed showed moderate thickening of descending/sigmoid colonic wall consistent with severe colitis as well as complex loculated cyst in the left adnexal region.  Pelvic ultrasound pending for further assessment of said cyst.  UA significant for large leukocytes, nitrate positive, many bacteria greater than 50 WBC and 0-5 squamous cells.  Mild leukocytosis with no left shift apparent on laboratory studies.  Upon reassessment of the patient, she communicated that she been having some clear white discharge for the last several days.  She is concerned about STD/STI exposure and wants to be tested.  GC and Chlamydia as well as wet prep ordered.  Patient declined HIV/syphilis testing at this time. 1615  Wet prep positive for clue cells 1732  Patient was educated results pending for GC/chlamydia.  She has access to MyChart on her phone and was agreeable to following results and contact her PCP or follow-up with urgent care regarding possible positive test results.   She is also recommended to follow-up with her OB/GYN within the next few days for reevaluation of her known ovarian cyst.  Bowel rest recommended with liquid diet.  Close follow-up with PCP regarding symptoms by beginning of the week recommended for reevaluation.  Worrisome signs and symptoms were discussed with the patient and the patient knowledge understanding to return to the emergency department if noticed.  Patient was stable upon discharge.      Barrie Dunker, Peter Garter 03/08/22 1733    03/10/22, MD 03/08/22 [phone]

## 2022-03-09 ENCOUNTER — Other Ambulatory Visit (HOSPITAL_COMMUNITY): Payer: Self-pay

## 2022-03-09 LAB — GC/CHLAMYDIA PROBE AMP (~~LOC~~) NOT AT ARMC
Chlamydia: POSITIVE — AB
Comment: NEGATIVE
Comment: NORMAL
Neisseria Gonorrhea: POSITIVE — AB

## 2022-03-10 LAB — URINE CULTURE: Culture: >=100000 — AB

## 2022-03-11 ENCOUNTER — Telehealth (HOSPITAL_BASED_OUTPATIENT_CLINIC_OR_DEPARTMENT_OTHER): Payer: Self-pay | Admitting: *Deleted

## 2022-03-11 NOTE — Telephone Encounter (Signed)
Post ED Visit - Positive Culture Follow-up  Culture report reviewed by antimicrobial stewardship pharmacist: [facility] []  , Pharm.D. []  Enzo Bi, Pharm.D., BCPS AQ-ID []  , Pharm.D., BCPS []  Celedonio Miyamoto, Pharm.D., BCPS []  [place], Garvin Fila.D., BCPS, AAHIVP []  , Pharm.D., BCPS, AAHIVP []  Georgina Pillion, PharmD, BCPS []  , PharmD, BCPS []  [place], PharmD, BCPS []  [address], PharmD []  , PharmD, BCPS []  Estella Husk, PharmD  Pharmacy Team []  Lysle Pearl, PharmD []  , PharmD []  Phillips Climes, PharmD []  , Rph []  Agapito Games) , PharmD []  Verlan Friends, PharmD []  , PharmD []  Mervyn Gay, PharmD []  , PharmD []  Vinnie Level, PharmD []  [facility], PharmD []  , PharmD [x]  Len Childs, PharmD   Positive urine culture Treated with Cephalexin, organism sensitive to the same and no further patient follow-up is required at this time.  03/11/2022, 11:31 AM

## 2023-02-07 IMAGING — CR DG KNEE COMPLETE 4+V*R*
4 series · 4 of 4 positions shown · non-contrast
Comparison: None.

CLINICAL DATA: Right leg and knee pain

EXAM:
RIGHT KNEE - COMPLETE 4+ VIEW

[x knee ap right (1 of 3)]
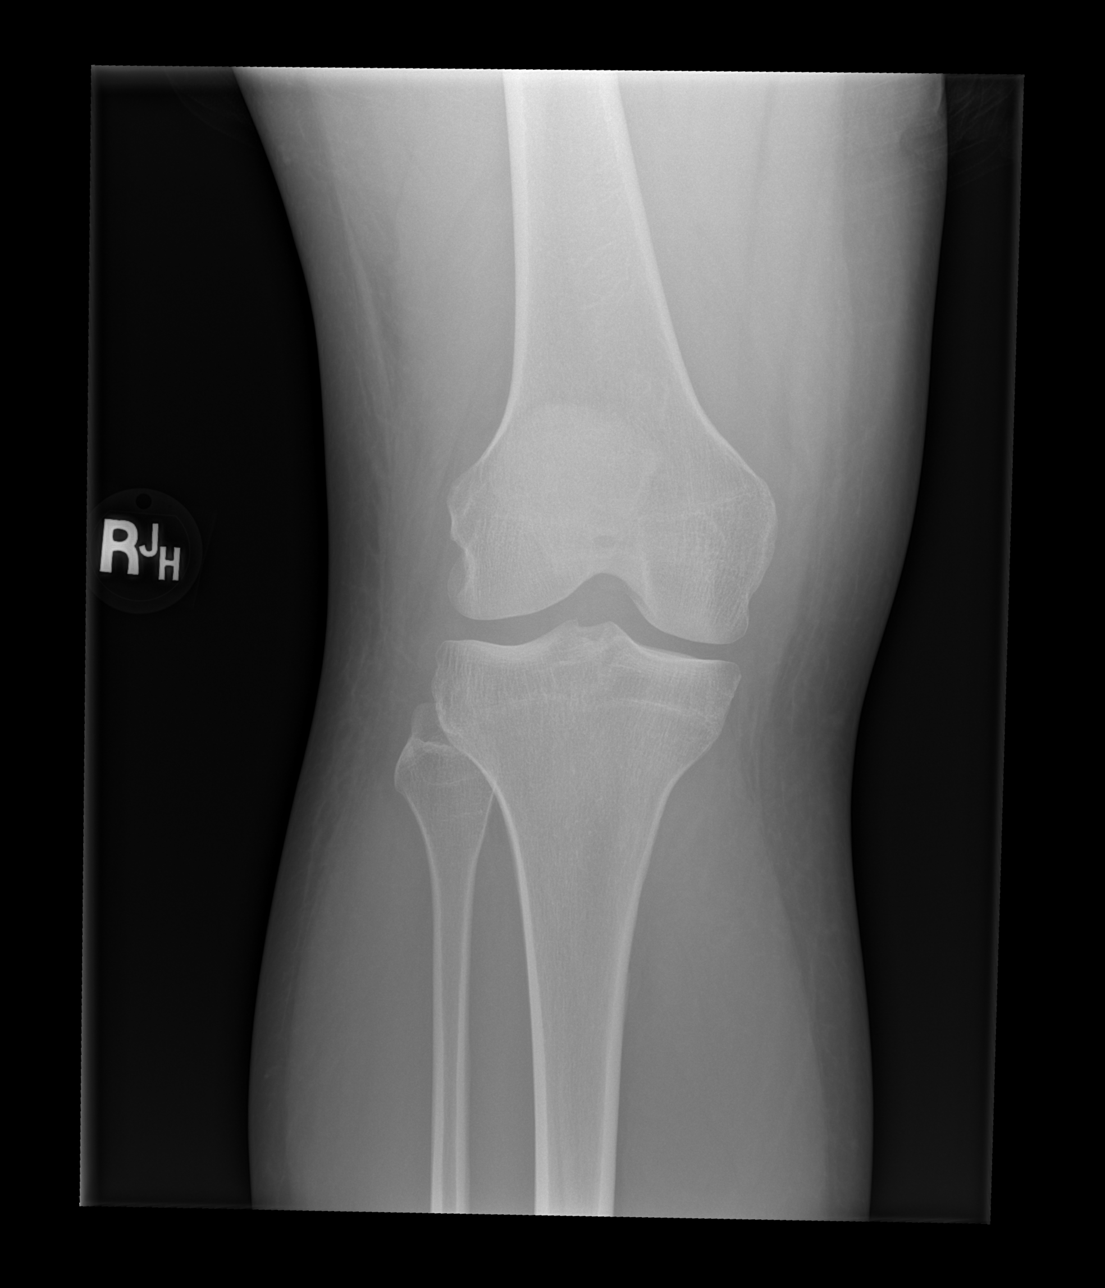

[x knee ap right (2 of 3)]
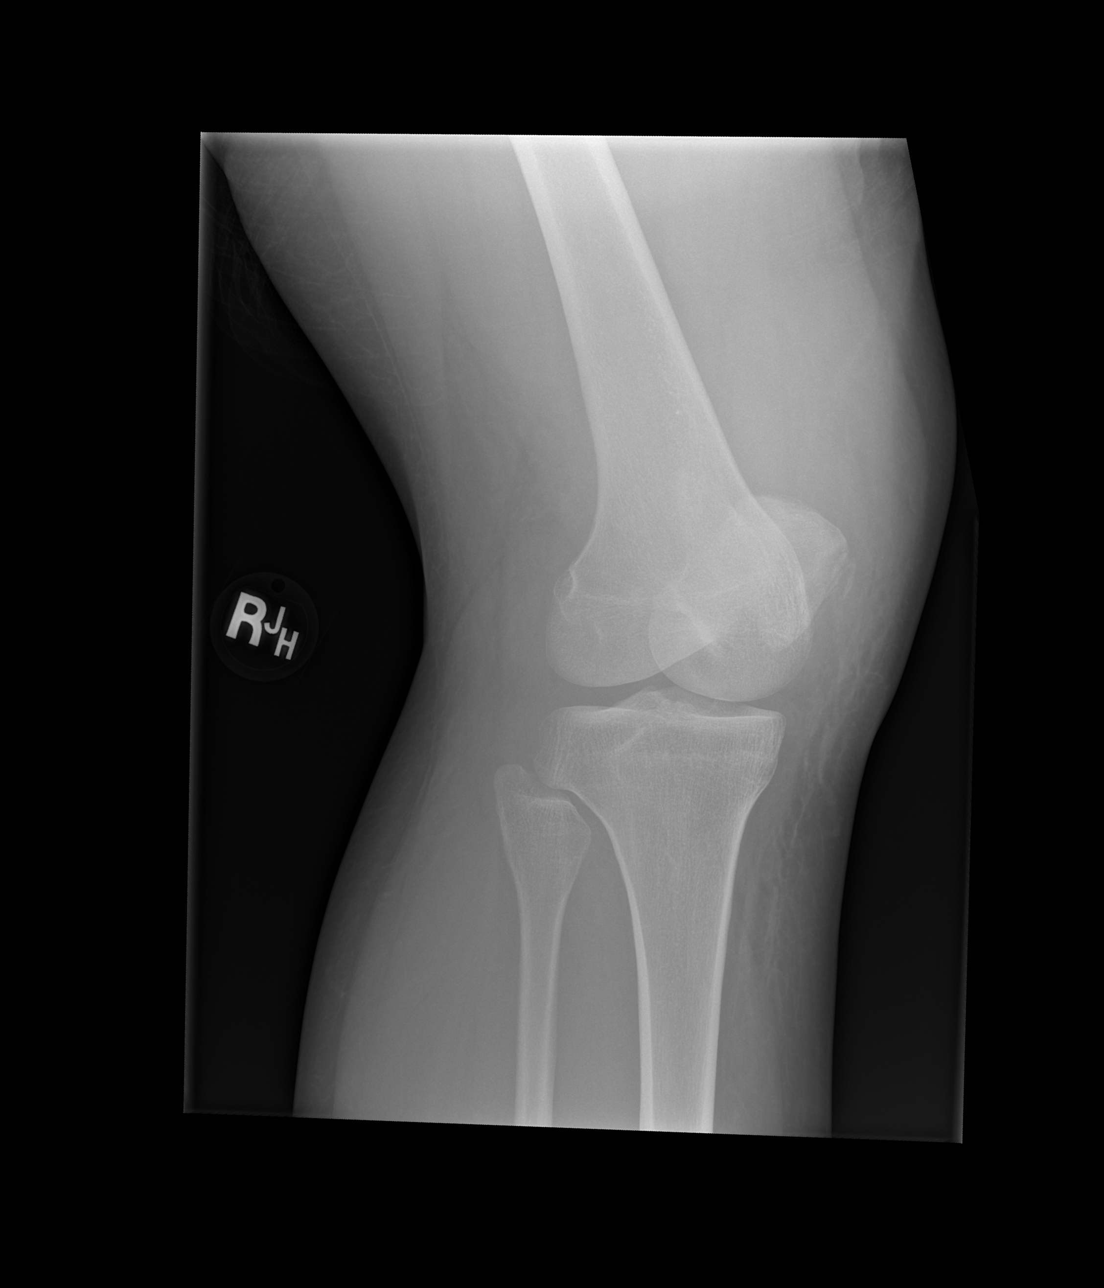

[x knee ap right (3 of 3)]
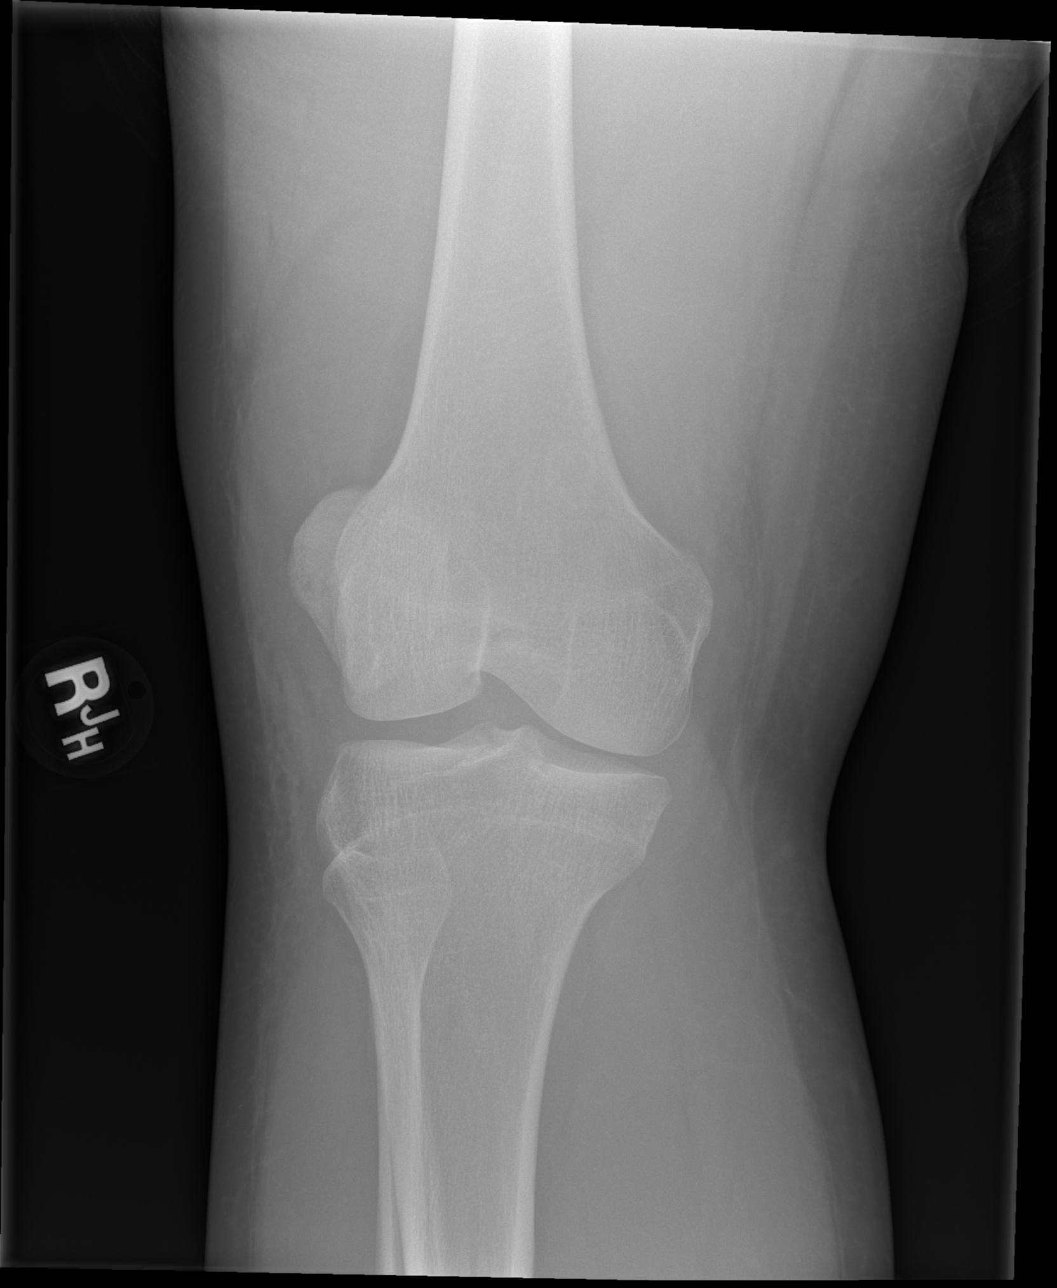

[x knee lat right]
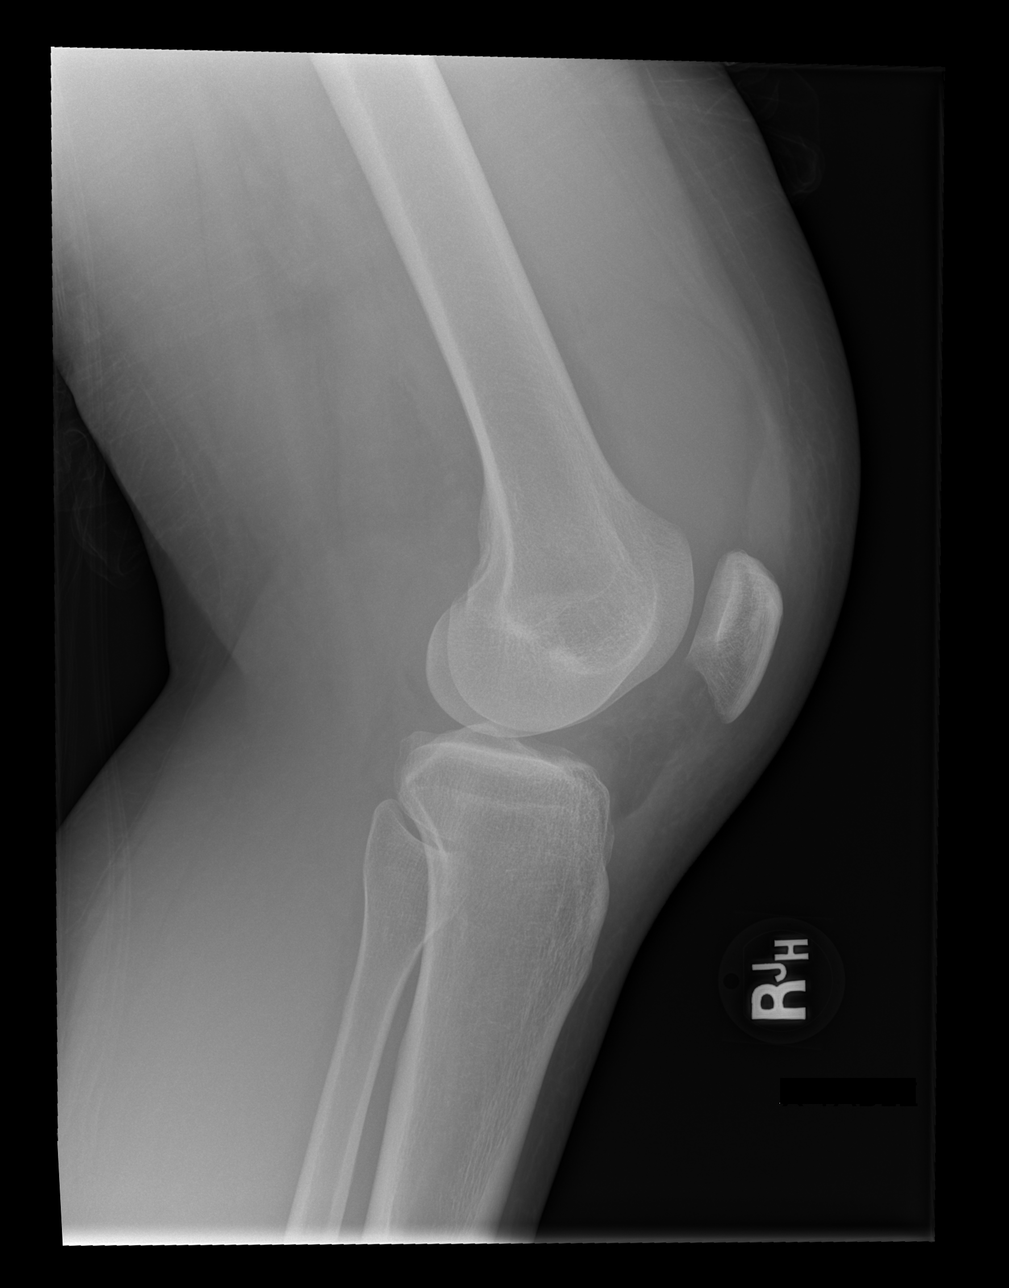

[4 of 4 positions shown; findings below may reference images not displayed]

FINDINGS: No acute bony abnormality. Specifically, no fracture, subluxation,
or dislocation. Large suprapatellar joint effusion. Anterior bowing
of the distal quadriceps tendon. Significant soft tissue swelling is
noted anterior to the patella as well as possible prepatellar bursal
thickening. No acute bony abnormality. Specifically, no fracture,
subluxation, or dislocation. Fragmented appearance of the
superomedial patella with a corticated margin suggesting a bipartite
configuration.
IMPRESSION: 1. Large suprapatellar joint effusion resulting in some anterior
bowing of the distal quadriceps tendon. Appearance concerning for
internal joint derangement.
2. Significant soft tissue swelling anterior to the patella as well
as possible prepatellar bursal thickening noted as well.
3. No acute fracture or traumatic malalignment.
4. Appearance suggesting a bipartite patella though could correlate
with point tenderness.

## 2023-07-23 IMAGING — US US OB LIMITED
1 series · 15 of 25 positions shown · non-contrast
Comparison: None.

CLINICAL DATA: Pregnancy, cramping, vaginal bleeding

EXAM:
LIMITED OBSTETRIC ULTRASOUND

[Series 1: us ob comp less 14 wks mc & wl · 15 of 25 slices shown]
[im 1/25]
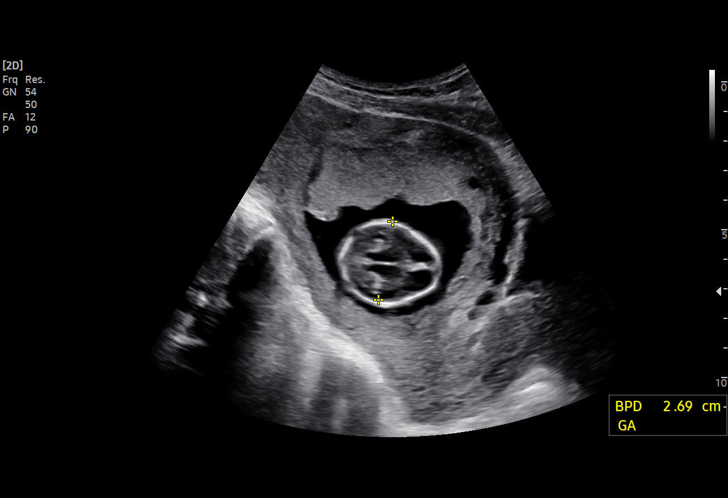
[im 3/25]
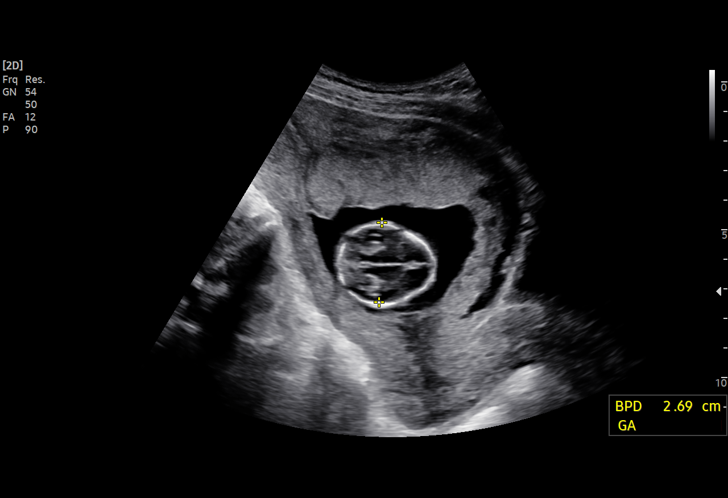
[im 5/25]
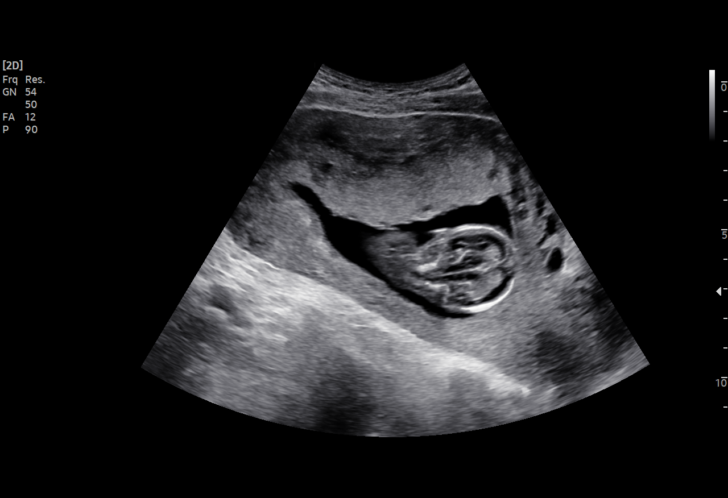
[im 6/25]
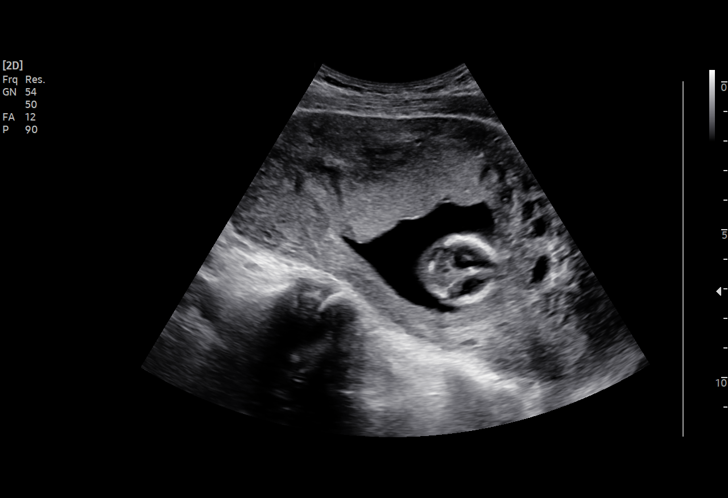
[im 8/25]
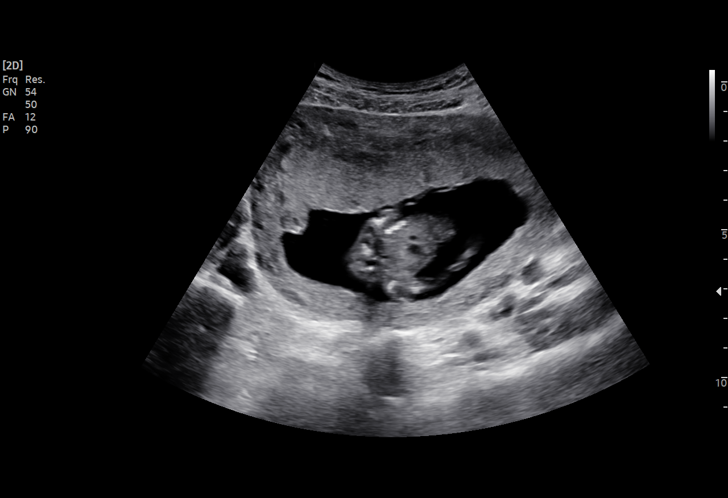
[im 10/25]
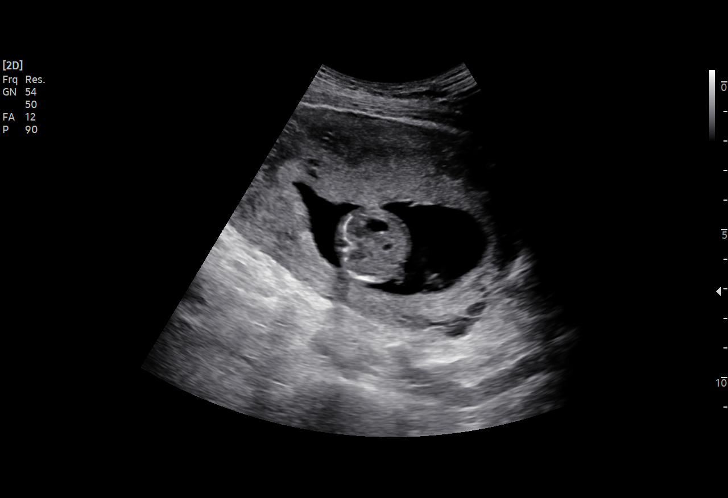
[im 11/25]
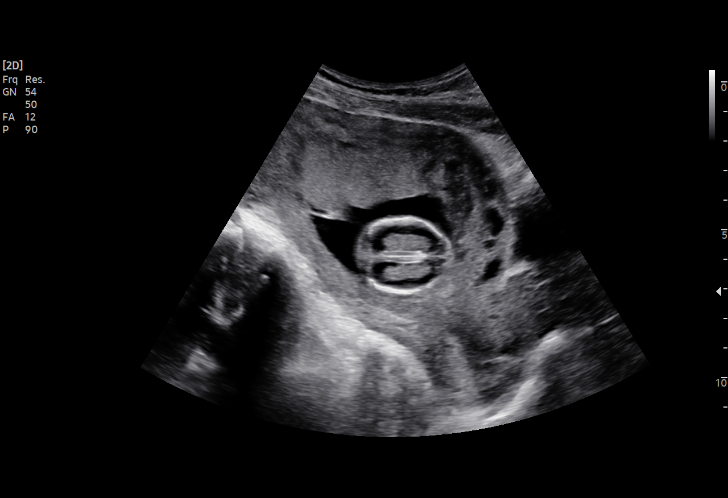
[im 13/25]
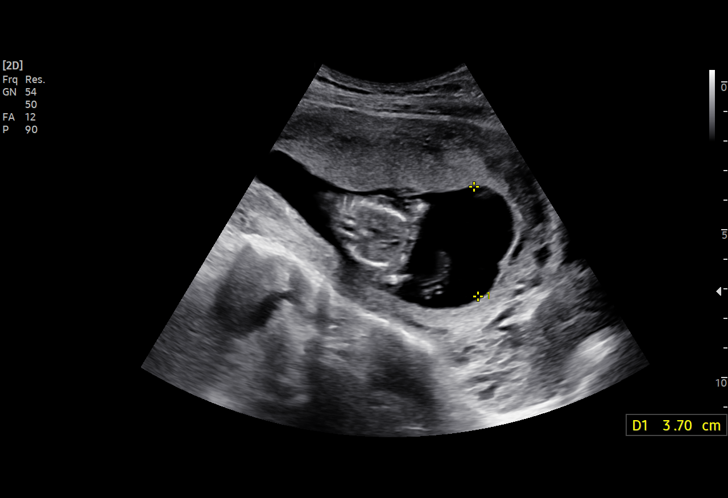
[im 15/25]
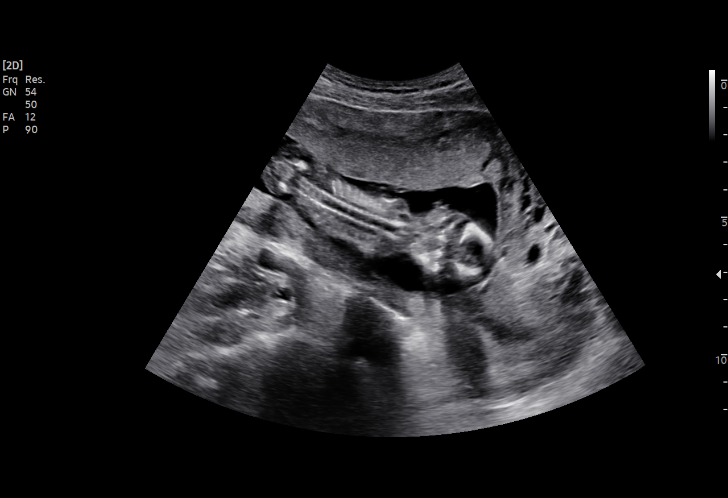
[im 16/25]
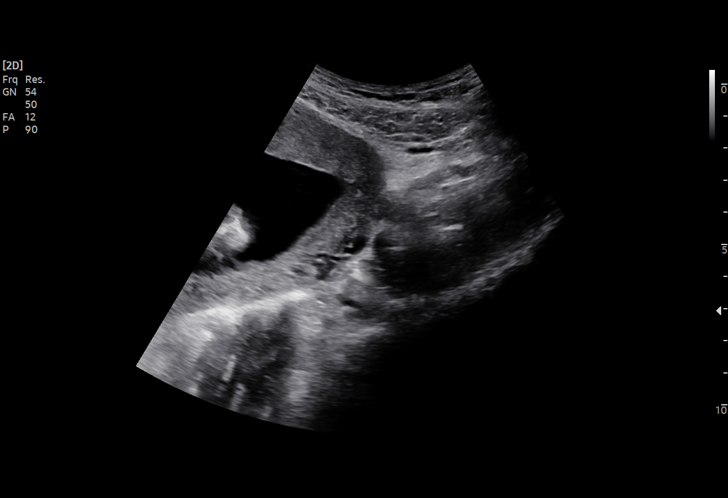
[im 18/25]
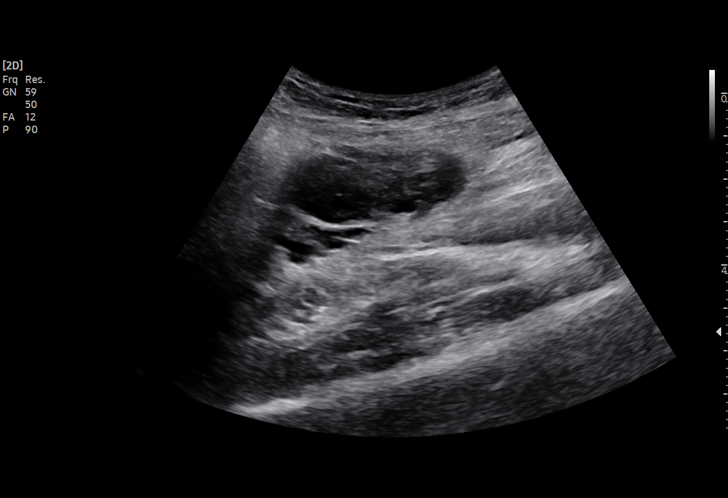
[im 20/25]
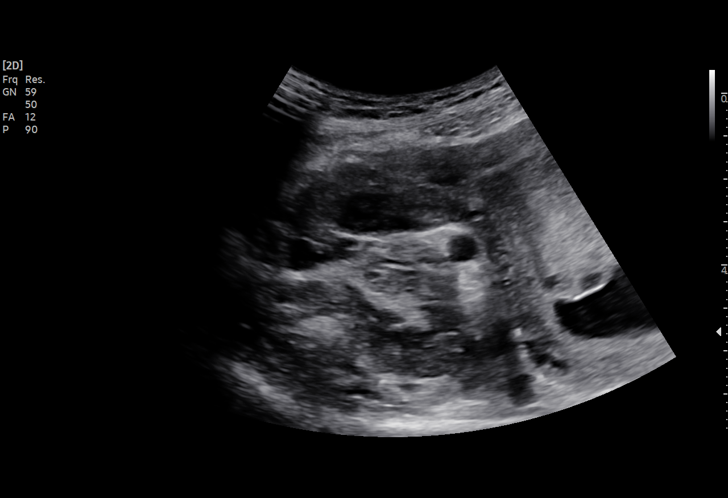
[im 21/25]
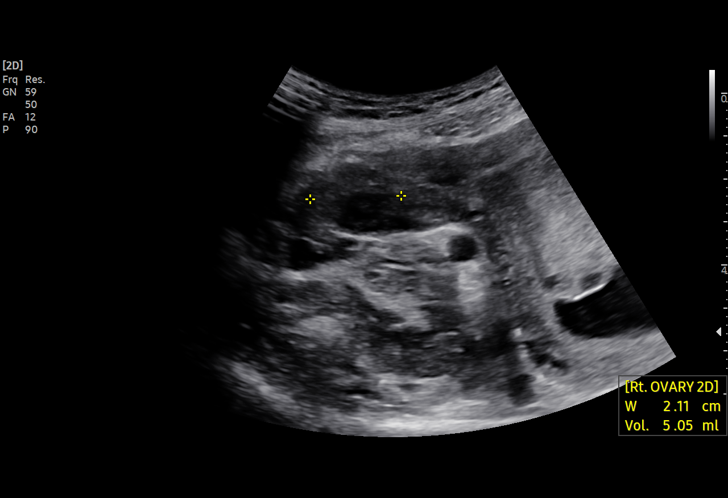
[im 23/25]
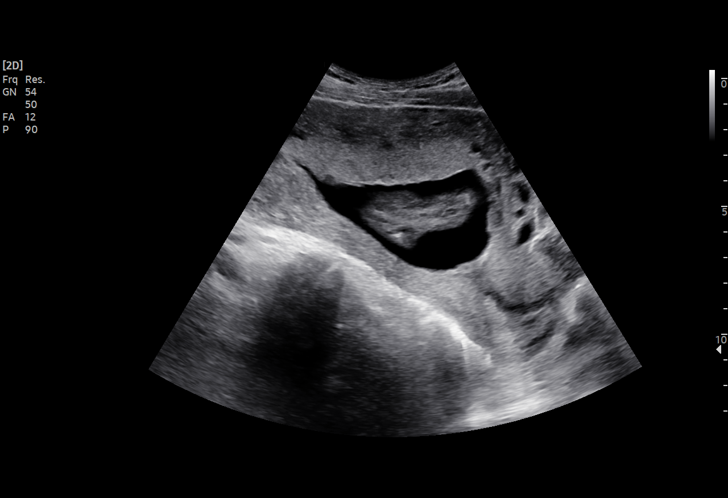
[im 25/25]
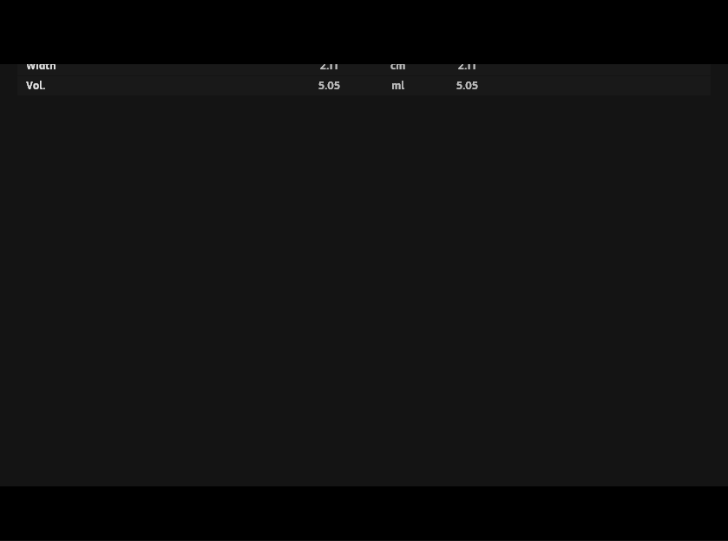

[15 of 25 positions shown; findings below may reference images not displayed]

FINDINGS: Number of Fetuses: 1

Heart Rate:  148 bpm

Movement: Yes

Presentation: Cephalic

Placental Location: Anterior

Previa: No

Amniotic Fluid (Subjective):  Within normal limits.

BPD: 6.7 cm cm 14 w  5 d

MATERNAL FINDINGS:

Cervix:  Appears closed.

Uterus/Adnexae: No abnormality visualized.
IMPRESSION: Single intrauterine pregnancy with detectable cardiac activity. No
sonographic abnormality identified.

Estimated gestational age 14 weeks 5 days.

This exam is performed on an emergent basis and does not
comprehensively evaluate fetal size, dating, or anatomy; follow-up
complete OB US should be considered if further fetal assessment is
warranted.

## 2023-07-23 IMAGING — DX DG FOOT 2V*L*
2 series · 2 of 2 positions shown · non-contrast
Comparison: None.

CLINICAL DATA: Toe injury. Left second toe pain since yesterday.
Jammed toe yesterday.

EXAM:
LEFT FOOT - 2 VIEW

[foot ap]
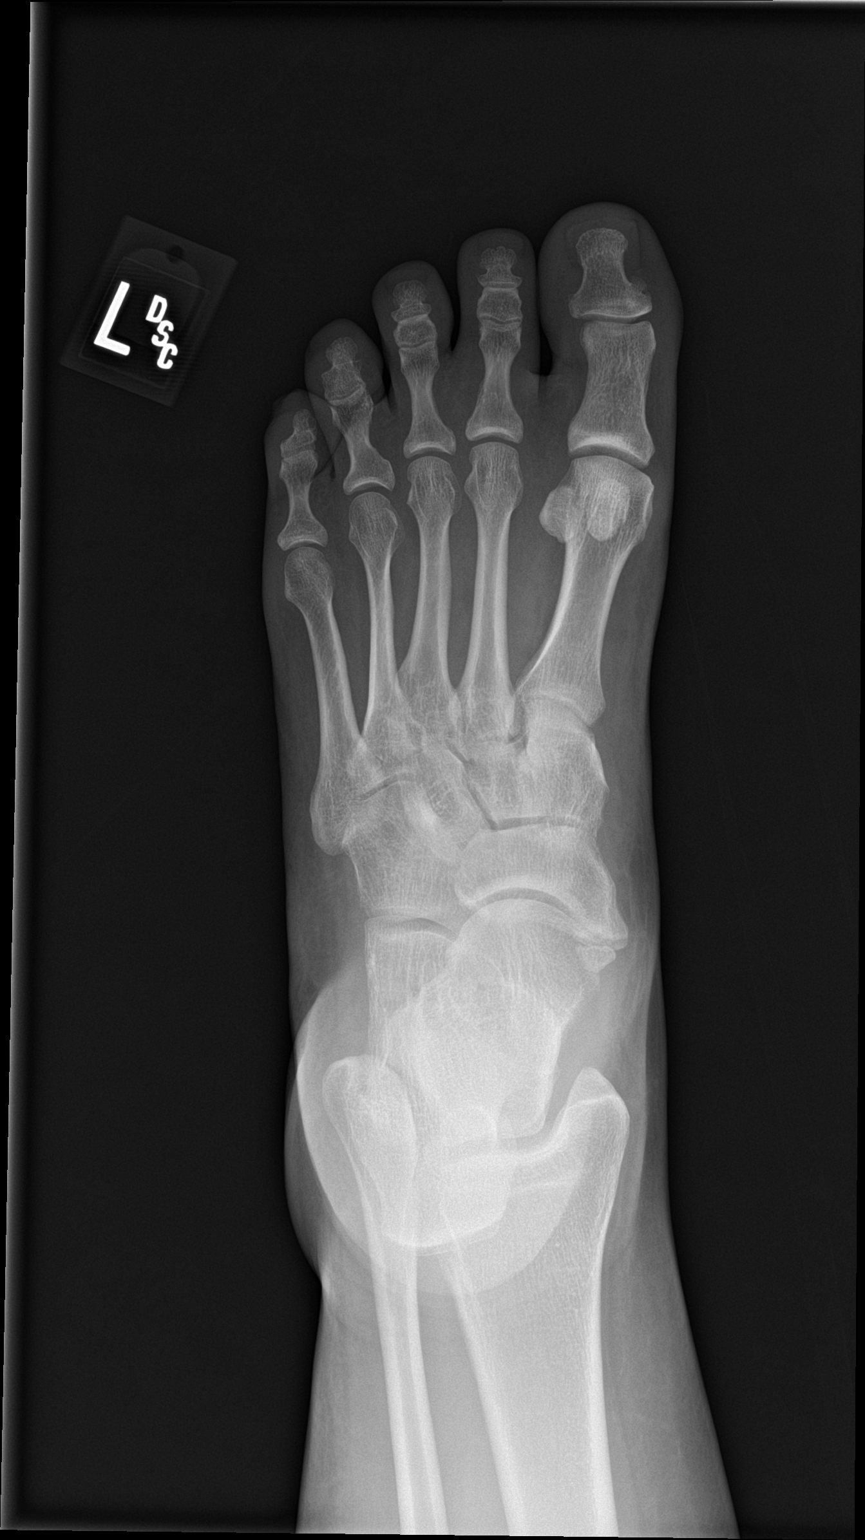

[foot lat]
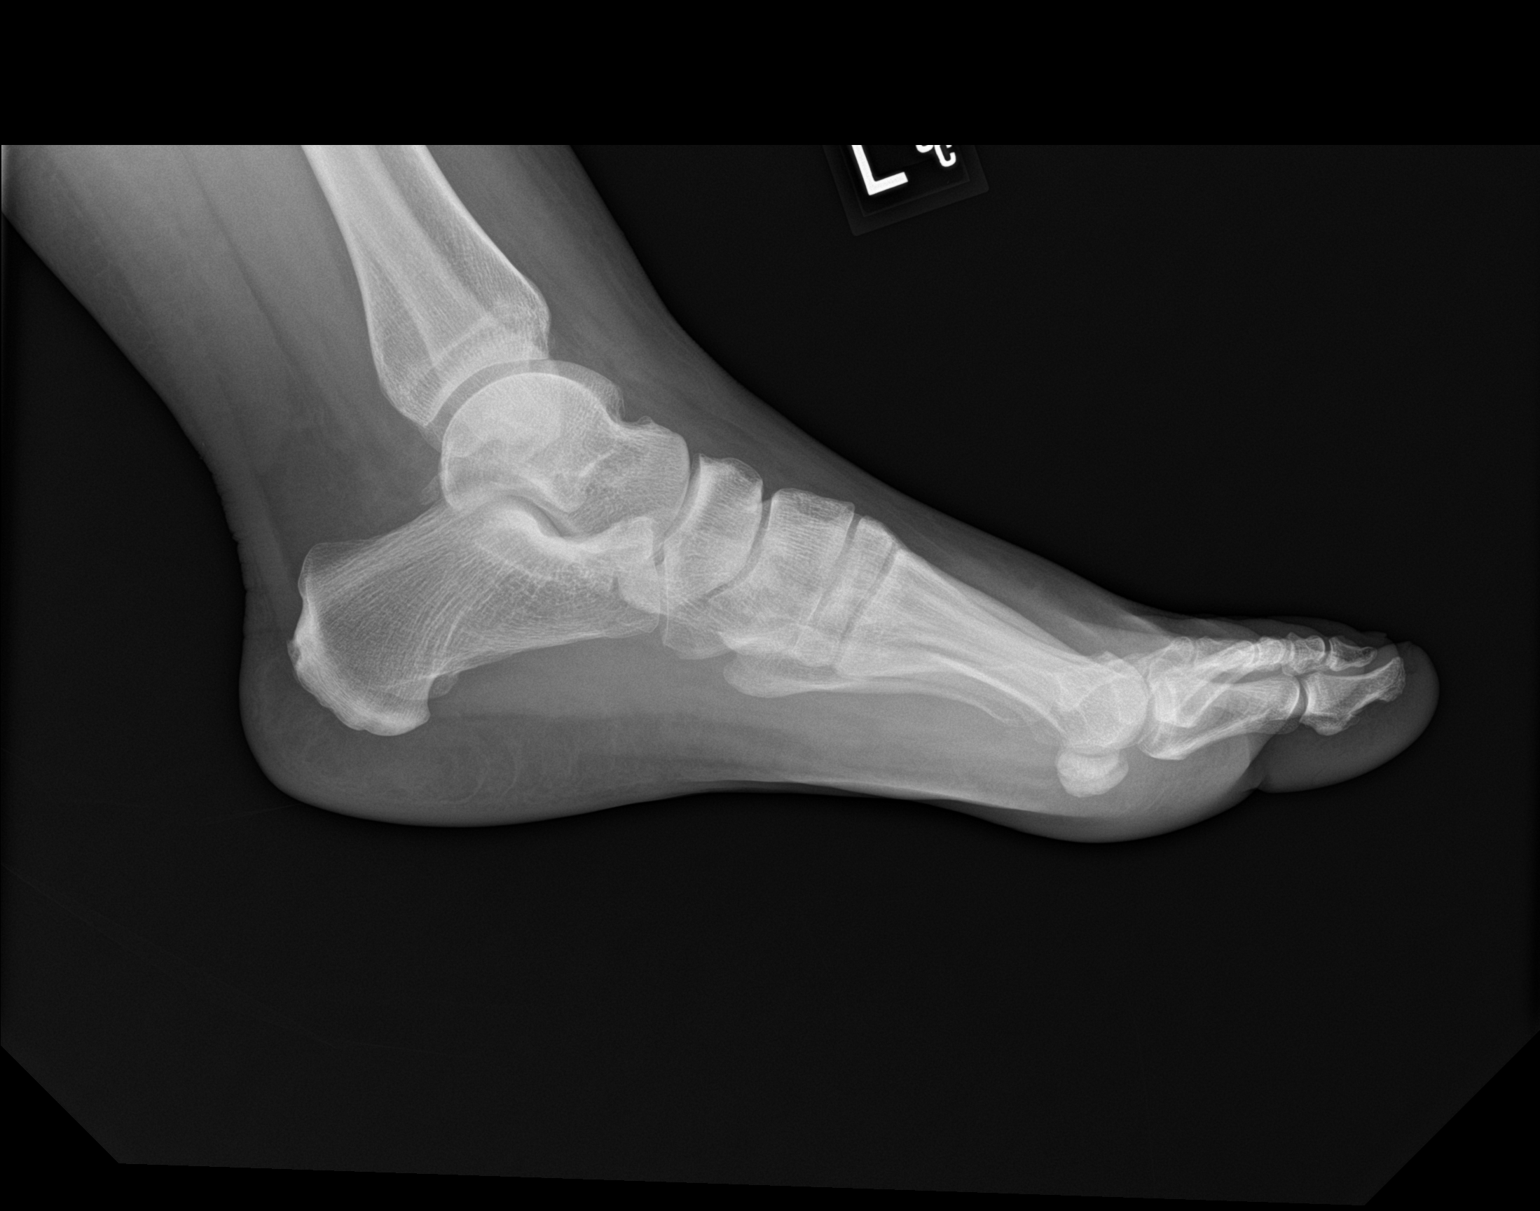

[2 of 2 positions shown; findings below may reference images not displayed]

FINDINGS: There is no evidence of fracture or dislocation. Particularly, no
visualized fracture of the second toe. Incidental os navicular.
There is no evidence of arthropathy or other focal bone abnormality.
Soft tissues are unremarkable.
IMPRESSION: Negative radiographs of the left foot. No fracture, with special
attention to the second toe.
